# Patient Record
Sex: Female | Born: 1985 | Race: White | Hispanic: No | Marital: Single | Smoking: Never smoker
Health system: Southern US, Community
[De-identification: ages and names within clinical notes are randomized; demographics above are authoritative.]

## PROBLEM LIST (undated history)

## (undated) DIAGNOSIS — N390 Urinary tract infection, site not specified: Secondary | ICD-10-CM

## (undated) DIAGNOSIS — D249 Benign neoplasm of unspecified breast: Secondary | ICD-10-CM

## (undated) DIAGNOSIS — K589 Irritable bowel syndrome without diarrhea: Secondary | ICD-10-CM

## (undated) HISTORY — DX: Urinary tract infection, site not specified: N39.0

## (undated) HISTORY — DX: Irritable bowel syndrome without diarrhea: K58.9

## (undated) HISTORY — PX: NASAL ENDOSCOPY: SHX286

## (undated) HISTORY — DX: Benign neoplasm of unspecified breast: D24.9

---

## 2010-10-09 ENCOUNTER — Encounter: Payer: Self-pay | Admitting: Internal Medicine

## 2010-11-06 ENCOUNTER — Encounter: Payer: Self-pay | Admitting: Internal Medicine

## 2010-11-28 ENCOUNTER — Encounter: Payer: Self-pay | Admitting: Internal Medicine

## 2010-12-02 HISTORY — PX: MASTECTOMY, PARTIAL: SHX709

## 2010-12-02 HISTORY — PX: BREAST LUMPECTOMY: SHX2

## 2011-08-23 ENCOUNTER — Encounter: Payer: Self-pay | Admitting: Internal Medicine

## 2011-08-23 ENCOUNTER — Other Ambulatory Visit: Payer: BC Managed Care – PPO | Admitting: Internal Medicine

## 2011-08-23 DIAGNOSIS — Z0000 Encounter for general adult medical examination without abnormal findings: Secondary | ICD-10-CM

## 2011-08-23 LAB — CBC WITH DIFFERENTIAL/PLATELET
Basophils Relative: 0 % (ref 0–1)
Hemoglobin: 12.9 g/dL (ref 12.0–15.0)
Lymphocytes Relative: 44 % (ref 12–46)
Lymphs Abs: 2.8 10*3/uL (ref 0.7–4.0)
MCHC: 32.4 g/dL (ref 30.0–36.0)
Monocytes Relative: 7 % (ref 3–12)
Neutro Abs: 2.9 10*3/uL (ref 1.7–7.7)
Neutrophils Relative %: 46 % (ref 43–77)
RBC: 4.61 MIL/uL (ref 3.87–5.11)
WBC: 6.3 10*3/uL (ref 4.0–10.5)

## 2011-08-23 LAB — COMPREHENSIVE METABOLIC PANEL
Albumin: 4.4 g/dL (ref 3.5–5.2)
BUN: 14 mg/dL (ref 6–23)
CO2: 20 mEq/L (ref 19–32)
Calcium: 9.3 mg/dL (ref 8.4–10.5)
Chloride: 109 mEq/L (ref 96–112)
Creat: 0.75 mg/dL (ref 0.50–1.10)
Glucose, Bld: 84 mg/dL (ref 70–99)
Potassium: 4.1 mEq/L (ref 3.5–5.3)

## 2011-08-23 LAB — TSH: TSH: 2.118 u[IU]/mL (ref 0.350–4.500)

## 2011-08-23 LAB — LIPID PANEL
Cholesterol: 137 mg/dL (ref 0–200)
HDL: 47 mg/dL (ref >39)

## 2011-08-24 LAB — VITAMIN D 25 HYDROXY (VIT D DEFICIENCY, FRACTURES): Vit D, 25-Hydroxy: 31 ng/mL (ref 30–89)

## 2011-08-25 ENCOUNTER — Encounter: Payer: Self-pay | Admitting: Internal Medicine

## 2011-08-26 ENCOUNTER — Ambulatory Visit (INDEPENDENT_AMBULATORY_CARE_PROVIDER_SITE_OTHER): Payer: BC Managed Care – PPO | Admitting: Internal Medicine

## 2011-08-26 ENCOUNTER — Encounter: Payer: Self-pay | Admitting: Internal Medicine

## 2011-08-26 VITALS — BP 112/60 | HR 88 | Temp 98.0°F | Ht 64.0 in | Wt 190.0 lb

## 2011-08-26 DIAGNOSIS — N6019 Diffuse cystic mastopathy of unspecified breast: Secondary | ICD-10-CM | POA: Insufficient documentation

## 2011-08-26 DIAGNOSIS — Z8719 Personal history of other diseases of the digestive system: Secondary | ICD-10-CM | POA: Insufficient documentation

## 2011-08-26 DIAGNOSIS — Z0000 Encounter for general adult medical examination without abnormal findings: Secondary | ICD-10-CM

## 2011-08-26 DIAGNOSIS — Z23 Encounter for immunization: Secondary | ICD-10-CM

## 2011-08-26 LAB — POCT URINALYSIS DIPSTICK
Bilirubin, UA: NEGATIVE
Ketones, UA: NEGATIVE
Leukocytes, UA: NEGATIVE
pH, UA: 5

## 2011-08-26 MED ORDER — TETANUS-DIPHTH-ACELL PERTUSSIS 5-2.5-18.5 LF-MCG/0.5 IM SUSP
0.5000 mL | Freq: Once | INTRAMUSCULAR | Status: AC
  Administered 2011-08-26: 0.5 mL via INTRAMUSCULAR

## 2011-08-26 NOTE — Progress Notes (Signed)
Subjective:    Patient ID: Brenda Roy, female    DOB: 06/19/1986, 25 y.o.   MRN: [id number]  HPI pleasant 25 year old female presents to the office for the first time today. 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 She is single. Formerly worked at [employer] in [place]. Parents reside near [place]. She has been angry as per for about 6 months. While in [place] she developed some constipation. Soll a gastroenterologist who subsequently performed a CAT scan. She was found to have a mass in her left breast and underwent a breast biopsy which she says was inconclusive and subsequently more tissue was taken. 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 No family history of breast cancer. Also, was diagnosed with irritable bowel syndrome and treated with Amitiza  8 mg for 2 months. It was expensive and she quit taking it but symptoms resolved. Says she had an MRI of her breast 6 months ago which was negative. I do not have any of her old records. She says she has had 2 Gardasil vaccines and is due to have a third one soon. She is to check with her  previous physician regarding these dates.  Sulfa causes a rash. She recently saw Dr. Alysia Penna, GYN physician. She has a history of migraine headaches and he would like for her to use move arrange continuously for 3 months before having a menstrual cycle. She cannot recall date of last tetanus but thinks it might have been around age 35. We administered a Tdap Vaccine today. Advised her to get influenza immunization through her employment. Patient does not smoke or consume alcohol.  Family history remarkable for father age 42 with hypertension. One brother age 64 in good health, mother age 18 in good health. Maternal uncle with history of esophageal  cancer.    Review of Systems  Constitutional: Negative.   HENT: Negative.   Eyes: Negative.   Respiratory: Negative.   Cardiovascular: Negative.   Gastrointestinal:       History of constipation and irritable bowel symptoms  Genitourinary: Negative.   Musculoskeletal: Negative.   Neurological:       History of migraine headaches  Hematological: Negative.   Psychiatric/Behavioral: Negative.        Objective:   Physical Exam  Constitutional: She is oriented to person, place, and time. She appears well-developed and well-nourished.  HENT:  Head: Normocephalic and atraumatic.  Right Ear: External ear normal.  Left Ear: External ear normal.  Mouth/Throat: Oropharynx is clear and moist.  Eyes: Conjunctivae and EOM are normal. Pupils are equal, round, and reactive to light. No scleral icterus.  Neck: Neck supple. No thyromegaly present.  Cardiovascular: Normal rate, regular rhythm and normal heart sounds.   Pulmonary/Chest:       Right breast at 12:00 has a 1 cm linear palpable mass consistent with fibrocystic breast disease. Patient not aware of this until I pointed out to her. Is to monitor her self breast exams and see if this lesion resolves over the next couple of months  Abdominal: Soft. Bowel sounds are normal. She exhibits no mass. There is no tenderness.  Genitourinary:       Deferred to GYN physician  Musculoskeletal: Normal range of motion.  Lymphadenopathy:    She has no cervical adenopathy.  Neurological: She is alert and  oriented to person, place, and time. She has normal reflexes. No cranial nerve deficit. Coordination normal.  Skin: Skin is warm and dry.  Psychiatric: She has a normal mood and affect.          Assessment & Plan:  Fibrocystic breast disease with apparent new area at 12:00 right breast with previous biopsy left breast  History of variable bowel syndrome  Health maintenance: Patient given TD at vaccine today and will check on her dates of  Gardasil vaccines which she says there have been 2 doses and we'll see when third dose is due. She will get influenza immunization through her work.  Return one year or as needed

## 2011-10-07 ENCOUNTER — Ambulatory Visit (INDEPENDENT_AMBULATORY_CARE_PROVIDER_SITE_OTHER): Payer: BC Managed Care – PPO | Admitting: Internal Medicine

## 2011-10-07 ENCOUNTER — Encounter: Payer: Self-pay | Admitting: Internal Medicine

## 2011-10-07 ENCOUNTER — Encounter: Payer: BC Managed Care – PPO | Admitting: Internal Medicine

## 2011-10-07 VITALS — BP 140/78 | HR 100 | Temp 99.5°F | Wt 191.0 lb

## 2011-10-07 DIAGNOSIS — N390 Urinary tract infection, site not specified: Secondary | ICD-10-CM

## 2011-10-07 NOTE — Progress Notes (Signed)
  Subjective:    Patient ID: Brenda Roy, female    DOB: 01-18-1986, 25 y.o.   MRN: [id number]  HPI onset of dysuria Thursday, November 1. No fever or shaking chills vomiting or back pain. Had one other urinary tract infection in college. Was treated with sulfa which she turn out to be allergic to.    Review of Systems     Objective:   Physical Exam no CVA tenderness.        Assessment & Plan:  Urinalysis is abnormal and will be cultured. Started on Levaquin 500 milligrams daily for 7 days for urinary tract infection. Call if symptoms worsen. May take Azo-Standard over-the-counter for dysuria

## 2011-10-07 NOTE — Patient Instructions (Signed)
Take Levaquin daily for 7 days with a meal. May take Azo-Standard over-the-counter for dysuria call if not better in 48 hours or if symptoms worsen

## 2011-10-10 LAB — URINE CULTURE

## 2011-12-09 ENCOUNTER — Encounter: Payer: Self-pay | Admitting: Internal Medicine

## 2012-06-22 ENCOUNTER — Encounter: Payer: Self-pay | Admitting: Internal Medicine

## 2012-06-22 ENCOUNTER — Ambulatory Visit (INDEPENDENT_AMBULATORY_CARE_PROVIDER_SITE_OTHER): Payer: BC Managed Care – PPO | Admitting: Internal Medicine

## 2012-06-22 ENCOUNTER — Ambulatory Visit
Admission: RE | Admit: 2012-06-22 | Discharge: 2012-06-22 | Disposition: A | Discharge status: Home / Self Care | Payer: BC Managed Care – PPO | Source: Ambulatory Visit | Attending: Internal Medicine | Admitting: Internal Medicine

## 2012-06-22 VITALS — BP 130/82 | HR 72 | Temp 98.0°F | Ht 64.0 in | Wt 162.0 lb

## 2012-06-22 DIAGNOSIS — R5383 Other fatigue: Secondary | ICD-10-CM

## 2012-06-22 DIAGNOSIS — R5381 Other malaise: Secondary | ICD-10-CM

## 2012-06-22 LAB — HEMOGLOBIN A1C
Hgb A1c MFr Bld: 5.4 % (ref <5.7)
Mean Plasma Glucose: 108 mg/dL (ref <117)

## 2012-06-22 LAB — POCT URINALYSIS DIPSTICK
Ketones, UA: NEGATIVE
Leukocytes, UA: NEGATIVE
Nitrite, UA: NEGATIVE
Protein, UA: NEGATIVE
Urobilinogen, UA: NEGATIVE

## 2012-06-22 LAB — CBC WITH DIFFERENTIAL/PLATELET
Basophils Absolute: 0 10*3/uL (ref 0.0–0.1)
Basophils Relative: 0 % (ref 0–1)
HCT: 39.9 % (ref 36.0–46.0)
Lymphocytes Relative: 45 % (ref 12–46)
MCHC: 32.8 g/dL (ref 30.0–36.0)
Monocytes Absolute: 0.5 10*3/uL (ref 0.1–1.0)
Neutro Abs: 2.7 10*3/uL (ref 1.7–7.7)
Neutrophils Relative %: 44 % (ref 43–77)
Platelets: 216 10*3/uL (ref 150–400)
RDW: 13.7 % (ref 11.5–15.5)
WBC: 6.1 10*3/uL (ref 4.0–10.5)

## 2012-06-23 ENCOUNTER — Other Ambulatory Visit: Payer: Self-pay

## 2012-06-23 LAB — COMPREHENSIVE METABOLIC PANEL
AST: 47 U/L — ABNORMAL HIGH (ref 0–37)
Albumin: 4.6 g/dL (ref 3.5–5.2)
CO2: 26 mEq/L (ref 19–32)
Calcium: 9.9 mg/dL (ref 8.4–10.5)
Creat: 0.85 mg/dL (ref 0.50–1.10)
Sodium: 141 mEq/L (ref 135–145)
Total Bilirubin: 0.7 mg/dL (ref 0.3–1.2)

## 2012-06-23 LAB — ROCKY MTN SPOTTED FVR AB, IGM-BLOOD: ROCKY MTN SPOTTED FEVER, IGM: 1.4 IV

## 2012-06-23 LAB — EPSTEIN-BARR VIRUS VCA ANTIBODY PANEL: EBV NA IgG: >600 U/mL — ABNORMAL HIGH (ref <18.0)

## 2012-06-23 LAB — T4, FREE: Free T4: 1.09 ng/dL (ref 0.80–1.80)

## 2012-06-23 LAB — IRON AND TIBC
Iron: 102 ug/dL (ref 42–145)
UIBC: 281 ug/dL (ref 125–400)

## 2012-06-23 LAB — ANTISTREPTOLYSIN O TITER: ASO: 93 IU/mL (ref 0–408)

## 2012-06-23 LAB — FOLATE: Folate: 15.9 ng/mL

## 2012-06-23 LAB — TSH: TSH: 1.639 u[IU]/mL (ref 0.350–4.500)

## 2012-06-23 LAB — SEDIMENTATION RATE: Sed Rate: 4 mm/hr (ref 0–22)

## 2012-06-23 MED ORDER — DOXYCYCLINE HYCLATE 100 MG PO TABS: 100.0000 mg | ORAL_TABLET | Freq: Two times a day (BID) | ORAL | Status: AC

## 2012-06-23 NOTE — Telephone Encounter (Signed)
Patient informed of positive RMSF titer, and the need for antibiotics. Doxycycline rx faxed to pharmacy. She is to return in 2 weeks for followup.

## 2012-06-24 LAB — CMV IGM: CMV IgM: 0.19

## 2012-06-27 NOTE — Progress Notes (Signed)
  Subjective:    Patient ID: Brenda Roy, female    DOB: [birth year], 26 y.o.   MRN: [id number]  HPI 26 year old white female complaining of extreme fatigue onset of couple of months ago. She moved to a different residence. She thought at first she was just tired from moving with the fatigue continuing to be an issue. She wanted to rest and sleep all the time. At night would rather rest and eat. She is generally very physically active. No history of headaches. No documented fever. No nausea, vomiting or weight loss. No cough or respiratory congestion. No night sweats. Has been following a 1390-calorie diet which seems a bit severe. She exercises several times a week. Says she does not think she is depressed. 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 She is single. History of left breast fibroadenoma. History of irritable bowel syndrome and constipation treated with Amitiza but not since moving to [place]. Found a tick in her bed recently. Sulfa causes a rash. History of migraine headaches. T. dap given September 2012. Does not smoke or consume alcohol.  Family history: Father age 31 with hypertension. One brother in good health. Mother in good health. Maternal uncle with history of esophageal cancer.      Review of Systems     Objective:   Physical Exam Skin is warm and dry without rashes;  nodes none; HEENT exam: TMs and pharynx are clear. Neck is supple without significant adenopathy or thyromegaly. No axillary adenopathy. No epitrochlear adenopathy. Chest clear to auscultation. Cardiac exam regular rate and rhythm without murmurs. Abdomen no hepatosplenomegaly masses or tenderness. Extremities without deformity. Neuro no focal deficits. Pelvic deferred to GYN physician. She is alert and oriented x3. Affect is normal.        Assessment & Plan:   Extreme fatigue or couple of  months- etiology unclear  Tick exposure  Calorie restricted diet  Possibilities include infectious disease, lymphoproliferative disorder, anemia, vitamin  deficiency, hypothyroidism or other endocrine disorder. We're going to do a battery of tests including sedimentation rate, CBC with differential, Sinemet, TSH, B12 and folate levels, iron and iron-binding capacity, CMV titers, Epstein-Barr titers, ASO titer, chest x-ray, [facility] spotted fever titer. Advise patient to increase calorie intake. Advise take multivitamin daily. Return in 2-3 weeks for followup.  Addendum: Patient found to have [place] spotted fever. Treat with doxycycline 100 mg twice daily for 10 days. Avoid sun exposure. Return after course of treatment.

## 2012-06-30 NOTE — Patient Instructions (Addendum)
Increase calorie intake. Await lab results. Try to get rest and take multivitamin daily.

## 2012-07-06 ENCOUNTER — Encounter: Payer: Self-pay | Admitting: Internal Medicine

## 2012-07-06 ENCOUNTER — Ambulatory Visit (INDEPENDENT_AMBULATORY_CARE_PROVIDER_SITE_OTHER): Payer: BC Managed Care – PPO | Admitting: Internal Medicine

## 2012-07-06 VITALS — BP 116/64 | HR 64 | Temp 99.1°F | Ht 64.0 in | Wt 162.5 lb

## 2012-07-06 DIAGNOSIS — R6889 Other general symptoms and signs: Secondary | ICD-10-CM

## 2012-07-06 DIAGNOSIS — A770 Spotted fever due to Rickettsia rickettsii: Secondary | ICD-10-CM

## 2012-07-06 DIAGNOSIS — R5383 Other fatigue: Secondary | ICD-10-CM

## 2012-07-06 DIAGNOSIS — R5381 Other malaise: Secondary | ICD-10-CM

## 2012-07-06 DIAGNOSIS — A779 Spotted fever, unspecified: Secondary | ICD-10-CM

## 2012-07-06 NOTE — Patient Instructions (Addendum)
Liver functions have been repeated today. Hopefully that will be normal. Return as needed.

## 2012-07-06 NOTE — Progress Notes (Signed)
  Subjective:    Patient ID: Brenda Roy, female    DOB: 12-09-1985, 26 y.o.   MRN: [id number]  HPI 26 year old female in today for followup on fatigue. Last visit she an extensive evaluation for fatigue. Thyroid functions, B12, folate were normal. Is concerned about her decreased calorie intake. She has tried to eat more food recently. Interestingly, she had a positive RMSF titer IgM. She had found a tick in her bed. She was treated with a ten-day course of doxycycline. Says she is taking fewer naps and trying to sleep just at bedtime. 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 She doesn't eat breakfast. Sometimes doesn't get lunch. Usually eats a large dinner. At last visit, she had a very mild SGOT elevation of  of 47. This will be repeated today   Review of Systems     Objective:   Physical Exam alert and oriented x3, in no acute distress, chest clear to auscultation, cardiac exam regular rate and rhythm        Assessment & Plan:  Mild elevation in SGOT-could be related to RMSF infection or alcohol intake  Recent RMSF-treated with doxycycline for 10 days  Fatigue-extensive workup reveals no vitamin deficiency, hypothyroidism, electrolyte imbalance etc. I do not think she was taking in enough calories on a daily basis. She was taking naps during the day .  Plan: Return when necessary. LFTs drawn.

## 2012-07-07 LAB — HEPATIC FUNCTION PANEL
Indirect Bilirubin: 0.4 mg/dL (ref 0.0–0.9)
Total Protein: 7 g/dL (ref 6.0–8.3)

## 2012-10-28 ENCOUNTER — Ambulatory Visit: Payer: BC Managed Care – PPO | Admitting: Internal Medicine

## 2013-02-02 ENCOUNTER — Ambulatory Visit (INDEPENDENT_AMBULATORY_CARE_PROVIDER_SITE_OTHER): Payer: BC Managed Care – PPO | Admitting: Internal Medicine

## 2013-02-02 ENCOUNTER — Encounter: Payer: Self-pay | Admitting: Internal Medicine

## 2013-02-02 VITALS — BP 122/74 | Temp 98.5°F | Ht 64.5 in | Wt 160.0 lb

## 2013-02-02 DIAGNOSIS — Z0000 Encounter for general adult medical examination without abnormal findings: Secondary | ICD-10-CM

## 2013-02-02 DIAGNOSIS — Z23 Encounter for immunization: Secondary | ICD-10-CM

## 2013-02-02 LAB — COMPREHENSIVE METABOLIC PANEL
ALT: 13 U/L (ref 0–35)
Albumin: 4.3 g/dL (ref 3.5–5.2)
CO2: 23 mEq/L (ref 19–32)
Chloride: 107 mEq/L (ref 96–112)
Glucose, Bld: 77 mg/dL (ref 70–99)
Potassium: 4.1 mEq/L (ref 3.5–5.3)
Sodium: 138 mEq/L (ref 135–145)
Total Protein: 6.8 g/dL (ref 6.0–8.3)

## 2013-02-02 LAB — CBC WITH DIFFERENTIAL/PLATELET
Hemoglobin: 12.4 g/dL (ref 12.0–15.0)
Lymphocytes Relative: 39 % (ref 12–46)
Lymphs Abs: 1.8 10*3/uL (ref 0.7–4.0)
MCH: 28.6 pg (ref 26.0–34.0)
Monocytes Relative: 8 % (ref 3–12)
Neutro Abs: 2.4 10*3/uL (ref 1.7–7.7)
Neutrophils Relative %: 49 % (ref 43–77)
RBC: 4.34 MIL/uL (ref 3.87–5.11)
WBC: 4.8 10*3/uL (ref 4.0–10.5)

## 2013-02-02 LAB — POCT URINALYSIS DIPSTICK
Bilirubin, UA: NEGATIVE
Ketones, UA: NEGATIVE
Leukocytes, UA: NEGATIVE
Nitrite, UA: NEGATIVE
Protein, UA: NEGATIVE

## 2013-02-02 LAB — LIPID PANEL
Cholesterol: 135 mg/dL (ref 0–200)
Triglycerides: 73 mg/dL (ref <150)

## 2013-02-02 MED ORDER — HPV QUADRIVALENT VACCINE IM SUSP: 0.5000 mL | Freq: Once | INTRAMUSCULAR | Status: AC

## 2013-02-02 NOTE — Patient Instructions (Addendum)
Third Gardasil vaccine given today. Return 2 years or as needed.

## 2013-02-02 NOTE — Progress Notes (Signed)
  Subjective:    Patient ID: Brenda Roy, female    DOB: 12-02-1986, 27 y.o.   MRN: [id number]  HPI 27 year old female initially presented to office September 2012. 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 She graduated from a lying [employer] with an undergraduate degree. She is single. She formerly worked at [employer] in [place]. Parents reside near [place]. History of irritable bowel syndrome. History of fibrocystic breast disease. Had mass in left breast removed while in [place]. Used to take Amitiza for constipation but no longer does so it doesn't need to. Bowel movements are regular. Patient says that she has had 2 Gardasil vaccines in [place] but vaccination dates are not available. We're going to go ahead and give her  third Gardasil today because she certain she's had 2 vaccines. Encouraged her to get those dates for [place].  Sulfa causes a rash. Dr. Alysia Penna is GYN physician. She has a history of migraine headaches but they have not been a problem recently. She has history dysmenorrhea. Does not want to be on oral contraceptives to control that. Had tetanus vaccine September 2012. Gets influenza immunization through employment. Patient does not smoke or consume alcohol.  Family history: Father with history of hypertension. One brother in good health. Mother in good health but has history of reflux. Paternal uncle with history of esophageal cancer from Barrett's esophagus who died recently at age 66.    Review of Systems  Constitutional: Negative.   Genitourinary:       Dysmenorrhea  All other systems reviewed and are negative.       Objective:   Physical Exam  Vitals reviewed. Constitutional: She is oriented to person, place, and time. She appears well-developed and well-nourished. No distress.  HENT:  Head:  Normocephalic and atraumatic.  Right Ear: External ear normal.  Left Ear: External ear normal.  Mouth/Throat: Oropharynx is clear and moist. No oropharyngeal exudate.  Eyes: Conjunctivae and EOM are normal. Pupils are equal, round, and reactive to light. Right eye exhibits no discharge. Left eye exhibits no discharge.  Neck: No JVD present. No thyromegaly present.  Cardiovascular: Normal rate, regular rhythm and normal heart sounds.   No murmur heard. Pulmonary/Chest: Effort normal and breath sounds normal. She has no wheezes. She has no rales.  Breasts normal female  Abdominal: Bowel sounds are normal. She exhibits no mass. There is no tenderness. There is no guarding.  Genitourinary:  Deferred to GYN  Musculoskeletal: Normal range of motion. She exhibits no edema.  Lymphadenopathy:    She has no cervical adenopathy.  Neurological: She is alert and oriented to person, place, and time. She has normal reflexes. No cranial nerve deficit. Coordination normal.  Skin: Skin is warm and dry. No rash noted. She is not diaphoretic.  Psychiatric: She has a normal mood and affect. Her behavior is normal. Judgment and thought content normal.          Assessment & Plan:  Normal health maintenance exam  History of fiber cystic breast disease  History of irritable bowel syndrome  Plan: Return in 1-2 years or as needed. Fasting labs drawn and are pending. Third Gardasil vaccine given today. Encouraged to get dates the first 2 vaccines from [place] to [place].

## 2013-02-02 NOTE — Addendum Note (Signed)
Addended by: Judy Pimple on: 02/02/2013 04:27 PM   Modules accepted: Orders

## 2013-02-03 LAB — VITAMIN D 25 HYDROXY (VIT D DEFICIENCY, FRACTURES): Vit D, 25-Hydroxy: 18 ng/mL — ABNORMAL LOW (ref 30–89)

## 2013-02-09 ENCOUNTER — Ambulatory Visit (INDEPENDENT_AMBULATORY_CARE_PROVIDER_SITE_OTHER): Payer: BC Managed Care – PPO | Admitting: Internal Medicine

## 2013-02-09 ENCOUNTER — Encounter: Payer: Self-pay | Admitting: Internal Medicine

## 2013-02-09 VITALS — BP 116/66 | Temp 99.1°F | Wt 160.0 lb

## 2013-02-09 DIAGNOSIS — B9789 Other viral agents as the cause of diseases classified elsewhere: Secondary | ICD-10-CM

## 2013-02-09 DIAGNOSIS — H6693 Otitis media, unspecified, bilateral: Secondary | ICD-10-CM

## 2013-02-09 DIAGNOSIS — J029 Acute pharyngitis, unspecified: Secondary | ICD-10-CM

## 2013-02-09 DIAGNOSIS — B349 Viral infection, unspecified: Secondary | ICD-10-CM

## 2013-02-09 DIAGNOSIS — H6690 Otitis media, unspecified, unspecified ear: Secondary | ICD-10-CM

## 2013-02-09 LAB — POCT RAPID STREP A (OFFICE): Rapid Strep A Screen: NEGATIVE

## 2013-02-10 ENCOUNTER — Encounter: Payer: Self-pay | Admitting: Internal Medicine

## 2013-02-10 NOTE — Patient Instructions (Addendum)
Take amoxicillin 500 mg 3 times daily for 10 days. Call if not better in 72 hours or sooner if worse.

## 2013-02-10 NOTE — Progress Notes (Signed)
  Subjective:    Patient ID: Brenda Roy, female    DOB: 1986/11/02, 27 y.o.   MRN: [id number]  HPI Patient was here recently for health maintenance exam. She went to [facility] to a basketball tournament and came down with respiratory infection on March 7. Has had malaise fatigue, cough and sore throat. Some chills but no myalgias. Has had headache for which she took Tylenol and Advil with some relief.    Review of Systems     Objective:   Physical Exam HEENT exam: Pharynx is injected without exudate. Rapid strep screen is negative. TMs are full bilaterally and slightly pink. Neck is supple without thyromegaly. Chest clear to auscultation without rales or wheezing. Skin is warm and dry.        Assessment & Plan:  Pharyngitis  Bilateral otitis media  Viral syndrome  Plan: Amoxicillin 500 mg 3 times daily for 10 days.

## 2013-03-01 ENCOUNTER — Ambulatory Visit (INDEPENDENT_AMBULATORY_CARE_PROVIDER_SITE_OTHER): Payer: BC Managed Care – PPO | Admitting: Internal Medicine

## 2013-03-01 ENCOUNTER — Encounter: Payer: Self-pay | Admitting: Internal Medicine

## 2013-03-01 ENCOUNTER — Telehealth: Payer: Self-pay | Admitting: Internal Medicine

## 2013-03-01 VITALS — BP 120/80 | Temp 99.3°F | Wt 161.0 lb

## 2013-03-01 DIAGNOSIS — L509 Urticaria, unspecified: Secondary | ICD-10-CM

## 2013-03-01 NOTE — Telephone Encounter (Signed)
Yes she needs to be seen

## 2013-03-01 NOTE — Telephone Encounter (Signed)
Appointment made for TODAY, 3/31 @ 2:45 p.m. Per MJB.  LMOM for patient on her cell #.  Advised patient to call back to confirm appt time ASAP.

## 2013-03-03 ENCOUNTER — Telehealth: Payer: Self-pay

## 2013-03-03 NOTE — Telephone Encounter (Signed)
Patient scheduled for an appointment with Dr. Lucie Leather. On 03/16/2013 at 8:00 am. Patient informed.

## 2013-04-04 ENCOUNTER — Encounter: Payer: Self-pay | Admitting: Internal Medicine

## 2013-04-04 DIAGNOSIS — Z872 Personal history of diseases of the skin and subcutaneous tissue: Secondary | ICD-10-CM | POA: Insufficient documentation

## 2013-04-04 NOTE — Patient Instructions (Addendum)
Takes Zantac 150 mg twice daily. Take Zyrtec 10 mg daily. Prescribed Sterapred DS 10 mg 6 day dosepak. EpiPen prescribed for recurrent episodes. Call if not better in one week.

## 2013-04-04 NOTE — Progress Notes (Signed)
  Subjective:    Patient ID: Ruby Cola, female    DOB: 08-10-1986, 27 y.o.   MRN: [id number]  HPI  27 year old female treated 02/09/2013  for respiratory infection with amoxicillin. On Friday, March 28 she ate a [place] sandwich on multigrain bread along with some chocolate. Did walk through the woods. Subsequently  scalp began to itch as well as her abdomen. Subsequently went to urgent care. Was given IM Benadryl and oral Benadryl. Has been taking Benadryl several times daily without relief. Patient reports being allergic to cannulate and pineapple. She had no difficulty breathing. 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 Still having itching today.    Review of Systems     Objective:   Physical Exam no angioedema. Pharynx is clear. TMs are clear. Has multiple hive like lesions on the abdomen. No evidence of insect bites.        Assessment & Plan:  Hives  Urticaria  Plan: Sterapred DS 10 mg 6 day dosepak. Zantac 150 mg twice daily. Zyrtec 10 mg daily. Call if not better in one week or sooner if worse. Prescription for EpiPen.

## 2013-09-20 ENCOUNTER — Encounter: Payer: Self-pay | Admitting: Internal Medicine

## 2013-09-20 ENCOUNTER — Ambulatory Visit (INDEPENDENT_AMBULATORY_CARE_PROVIDER_SITE_OTHER): Payer: BC Managed Care – PPO | Admitting: Internal Medicine

## 2013-09-20 VITALS — BP 120/70 | HR 80 | Temp 98.2°F | Ht 65.0 in | Wt 166.0 lb

## 2013-09-20 DIAGNOSIS — J020 Streptococcal pharyngitis: Secondary | ICD-10-CM

## 2013-09-20 DIAGNOSIS — B084 Enteroviral vesicular stomatitis with exanthem: Secondary | ICD-10-CM

## 2013-09-20 DIAGNOSIS — Z112 Encounter for screening for other bacterial diseases: Secondary | ICD-10-CM

## 2013-09-20 LAB — POCT RAPID STREP A (OFFICE): Rapid Strep A Screen: NEGATIVE

## 2013-09-20 NOTE — Progress Notes (Signed)
  Subjective:    Patient ID: Brenda Roy, female    DOB: 23-Aug-1986, 27 y.o.   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 had onset of fever back pain sore throat 3 days ago. Fever and back pain have improved. Her hands and feet hurt. Says temperature was up to 101.8. Her left ear has been hurting. She has been around a 47-month-old nephew who was sick. Has noticed some fine lesions on her fingers. Feet hurt when she bears weight. She is a Psychologist, educational for the women's basketball team.    Review of Systems     Objective:   Physical Exam pharynx is red. Rapid strep screen negative. No exudate noted. Left TM is full but not red. Right TM okay. Neck is supple. Chest clear. She has scattered discrete tiny papules lateral aspects of her fingers both hands.        Assessment & Plan:  Hand-foot-and-mouth disease  Plan: Recommend Advil and Tylenol for pain control. She declined offer for pain medication. Out of work for 5 days starting on Saturday when she had onset of symptoms which was October 18.

## 2014-02-07 ENCOUNTER — Other Ambulatory Visit: Payer: BC Managed Care – PPO | Admitting: Internal Medicine

## 2014-02-07 DIAGNOSIS — Z0000 Encounter for general adult medical examination without abnormal findings: Secondary | ICD-10-CM

## 2014-02-07 DIAGNOSIS — Z13220 Encounter for screening for lipoid disorders: Secondary | ICD-10-CM

## 2014-02-07 DIAGNOSIS — Z13228 Encounter for screening for other metabolic disorders: Secondary | ICD-10-CM

## 2014-02-07 DIAGNOSIS — Z1329 Encounter for screening for other suspected endocrine disorder: Secondary | ICD-10-CM

## 2014-02-07 DIAGNOSIS — Z130 Encounter for screening for diseases of the blood and blood-forming organs and certain disorders involving the immune mechanism: Secondary | ICD-10-CM

## 2014-02-07 LAB — CBC WITH DIFFERENTIAL/PLATELET
BASOS ABS: 0 10*3/uL (ref 0.0–0.1)
Basophils Relative: 0 % (ref 0–1)
Eosinophils Absolute: 0.1 10*3/uL (ref 0.0–0.7)
Eosinophils Relative: 2 % (ref 0–5)
HEMATOCRIT: 40.8 % (ref 36.0–46.0)
Hemoglobin: 13.6 g/dL (ref 12.0–15.0)
LYMPHS PCT: 41 % (ref 12–46)
Lymphs Abs: 2.9 10*3/uL (ref 0.7–4.0)
MCH: 28.6 pg (ref 26.0–34.0)
MCHC: 33.3 g/dL (ref 30.0–36.0)
MCV: 85.7 fL (ref 78.0–100.0)
Monocytes Absolute: 0.4 10*3/uL (ref 0.1–1.0)
Monocytes Relative: 6 % (ref 3–12)
NEUTROS ABS: 3.6 10*3/uL (ref 1.7–7.7)
Neutrophils Relative %: 51 % (ref 43–77)
PLATELETS: 223 10*3/uL (ref 150–400)
RBC: 4.76 MIL/uL (ref 3.87–5.11)
RDW: 13.8 % (ref 11.5–15.5)
WBC: 7 10*3/uL (ref 4.0–10.5)

## 2014-02-07 LAB — COMPREHENSIVE METABOLIC PANEL
ALBUMIN: 4.4 g/dL (ref 3.5–5.2)
ALT: 19 U/L (ref 0–35)
AST: 15 U/L (ref 0–37)
Alkaline Phosphatase: 36 U/L — ABNORMAL LOW (ref 39–117)
BUN: 13 mg/dL (ref 6–23)
CALCIUM: 9.5 mg/dL (ref 8.4–10.5)
CHLORIDE: 104 meq/L (ref 96–112)
CO2: 23 mEq/L (ref 19–32)
Creat: 0.82 mg/dL (ref 0.50–1.10)
Glucose, Bld: 82 mg/dL (ref 70–99)
POTASSIUM: 3.9 meq/L (ref 3.5–5.3)
Sodium: 136 mEq/L (ref 135–145)
Total Bilirubin: 0.6 mg/dL (ref 0.2–1.2)
Total Protein: 7.2 g/dL (ref 6.0–8.3)

## 2014-02-07 LAB — LIPID PANEL
Cholesterol: 181 mg/dL (ref 0–200)
HDL: 57 mg/dL (ref >39)
LDL Cholesterol: 96 mg/dL (ref 0–99)
Total CHOL/HDL Ratio: 3.2 Ratio
Triglycerides: 140 mg/dL (ref <150)
VLDL: 28 mg/dL (ref 0–40)

## 2014-02-07 LAB — TSH: TSH: 1.184 u[IU]/mL (ref 0.350–4.500)

## 2014-02-08 ENCOUNTER — Encounter: Payer: Self-pay | Admitting: Internal Medicine

## 2014-02-08 ENCOUNTER — Ambulatory Visit (INDEPENDENT_AMBULATORY_CARE_PROVIDER_SITE_OTHER): Payer: BC Managed Care – PPO | Admitting: Internal Medicine

## 2014-02-08 VITALS — BP 128/68 | HR 76 | Temp 99.0°F | Ht 64.5 in | Wt 172.0 lb

## 2014-02-08 DIAGNOSIS — Z0000 Encounter for general adult medical examination without abnormal findings: Secondary | ICD-10-CM

## 2014-02-08 LAB — POCT URINALYSIS DIPSTICK
BILIRUBIN UA: NEGATIVE
Glucose, UA: NEGATIVE
Ketones, UA: NEGATIVE
Leukocytes, UA: NEGATIVE
Nitrite, UA: NEGATIVE
Protein, UA: NEGATIVE
SPEC GRAV UA: 1.015
UROBILINOGEN UA: NEGATIVE
pH, UA: 6

## 2014-02-08 LAB — VITAMIN D 25 HYDROXY (VIT D DEFICIENCY, FRACTURES): Vit D, 25-Hydroxy: 37 ng/mL (ref 30–89)

## 2014-02-08 NOTE — Patient Instructions (Addendum)
Return in 1-2 years or as needed. Good luck in school. Was a pleasure seeing you today.

## 2014-02-08 NOTE — Progress Notes (Signed)
   Subjective:    Patient ID: Brenda Roy, female    DOB: [birth year], 28 y.o.   MRN: [id number]  HPI  27 year old Female for health maintenance. 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 but she plans to leave that position and attend [employer] to obtain a degree in hospital administration. 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  Social history: She is single. Does not smoke. Parents reside near [place]. Does not consume alcohol.  Family history: Father with history of hypertension. One brother in good health. Mother in good health but has history of reflux. Paternal uncle with history of esophageal cancer from Barrett's esophagus who died at age 98.  Sulfa causes a rash.  Dr. Evlyn Kanner is GYN physician.  Past medical history: History of migraine headaches. History of dysmenorrhea. Tetanus immunization September 2012. Gets influenza immunization through employment. Has had Gardasil series. History of fibrocystic breast disease. Had mass in left breast removed while in [place]. Used to take Amitiza for chronic constipation but no longer needs to do that. Bowel movements have been regular.      Review of Systems     Objective:   Physical Exam  Vitals reviewed. Constitutional: She is oriented to person, place, and time. She appears well-developed and well-nourished. No distress.  HENT:  Head: Normocephalic and atraumatic.  Right Ear: External ear normal.  Left Ear: External ear normal.  Mouth/Throat: Oropharynx is clear and moist. No oropharyngeal exudate.  Eyes: Conjunctivae and EOM are normal. Pupils are equal, round, and reactive to light. Right eye exhibits no discharge. Left eye exhibits no discharge. No scleral icterus.  Neck: Normal range of motion. Neck supple. No JVD present. No thyromegaly present.  Cardiovascular: Normal rate, regular rhythm and normal heart sounds.   No murmur heard. Pulmonary/Chest: Effort normal  and breath sounds normal. No respiratory distress. She has no rales. She exhibits no tenderness.  Breasts normal female  Abdominal: Soft. Bowel sounds are normal. She exhibits no distension and no mass. There is no tenderness. There is no rebound and no guarding.  Genitourinary:  Deferred to Dr. Deatra Ina. Is on menses now  Musculoskeletal: Normal range of motion. She exhibits no edema.  Lymphadenopathy:    She has no cervical adenopathy.  Neurological: She is alert and oriented to person, place, and time. She has normal reflexes. She displays normal reflexes. No cranial nerve deficit. Coordination normal.  Skin: Skin is warm and dry. No rash noted. She is not diaphoretic.  Psychiatric: She has a normal mood and affect. Her behavior is normal. Judgment and thought content normal.          Assessment & Plan:  History of dysmenorrhea  History of migraine headaches  History of fibrocystic breast disease  Plan: All of these issues are stable at present time return one to 2 years or when necessary. Continue to see Dr. Deatra Ina for GYN care. Urinalysis contains occult blood today because patient is on menstrual period.

## 2014-06-14 ENCOUNTER — Ambulatory Visit (INDEPENDENT_AMBULATORY_CARE_PROVIDER_SITE_OTHER): Payer: BC Managed Care – PPO | Admitting: Internal Medicine

## 2014-06-14 ENCOUNTER — Encounter: Payer: Self-pay | Admitting: Internal Medicine

## 2014-06-14 VITALS — BP 140/82 | HR 80 | Temp 100.2°F | Wt 174.0 lb

## 2014-06-14 DIAGNOSIS — R300 Dysuria: Secondary | ICD-10-CM

## 2014-06-14 MED ORDER — LEVOFLOXACIN 500 MG PO TABS: 500.0000 mg | ORAL_TABLET | Freq: Every day | ORAL | Status: DC

## 2014-06-14 NOTE — Patient Instructions (Signed)
Take Levaquin for 7 days once daily with a meal as directed

## 2014-06-14 NOTE — Progress Notes (Signed)
   Subjective:    Patient ID: Brenda Roy, female    DOB: May 24, 1986, 28 y.o.   MRN: [id number]  HPI has noticed decreased flow in urine for a couple of weeks. Has had some vague dysuria. Today was wiping and saw some blood on toilet tissue. No groin pain or back pain. Has low-grade fever. No chills. Urinalysis today is really unremarkable blood culture was sent. Had UTI symptoms treated with Levaquin in 2012. Culture grew MRSA 40,000 colonies/ml . Has menses every 3 months.    Review of Systems     Objective:   Physical Exam  Examination of external genitalia shows no active bleeding or concerning lesions      Assessment & Plan:  Plan: Levaquin 500 milligrams daily for 7 days. Urine culture pending. Dysuria  Plan:  Dysuria  Plan: Levaquin 500 milligrams daily for 7 days. Culture pending.

## 2014-06-14 NOTE — Addendum Note (Signed)
Addended by: Brett Canales on: 06/14/2014 05:03 PM   Modules accepted: Orders

## 2014-06-16 LAB — URINE CULTURE

## 2014-06-24 ENCOUNTER — Encounter: Payer: Self-pay | Admitting: Internal Medicine

## 2014-06-24 ENCOUNTER — Ambulatory Visit (INDEPENDENT_AMBULATORY_CARE_PROVIDER_SITE_OTHER): Payer: BC Managed Care – PPO | Admitting: Internal Medicine

## 2014-06-24 VITALS — BP 116/74 | HR 88 | Temp 98.2°F | Wt 174.0 lb

## 2014-06-24 DIAGNOSIS — R300 Dysuria: Secondary | ICD-10-CM

## 2014-06-24 DIAGNOSIS — N909 Noninflammatory disorder of vulva and perineum, unspecified: Secondary | ICD-10-CM

## 2014-06-24 LAB — POCT URINALYSIS DIPSTICK
Bilirubin, UA: NEGATIVE
Blood, UA: NEGATIVE
Glucose, UA: NEGATIVE
KETONES UA: NEGATIVE
LEUKOCYTES UA: NEGATIVE
Nitrite, UA: NEGATIVE
PH UA: 6
PROTEIN UA: NEGATIVE
Spec Grav, UA: <=1.005
UROBILINOGEN UA: NEGATIVE

## 2014-06-24 MED ORDER — CLOTRIMAZOLE-BETAMETHASONE 1-0.05 % EX CREA: 1.0000 "application " | TOPICAL_CREAM | Freq: Two times a day (BID) | CUTANEOUS | Status: DC

## 2014-06-24 NOTE — Progress Notes (Signed)
   Subjective:    Patient ID: Brenda Roy, female    DOB: August 16, 1986, 28 y.o.   MRN: [id number]  HPI  Seen recently and treated for dysuria with antibiotics. Urine stream improved. She had seen one spot of blood prior to coming to the office. Has not seen any more blood. Urinalysis at last visit in today completely within normal limits. No itching in genital area.    Review of Systems     Objective:   Physical Exam  Not examined. Urine dipstick is normal      Assessment & Plan:  Genital irritation  Plan: Lotrisone to use sparingly twice daily for 7-10 days with refills. If symptoms do not improve, she may need to see urologist.

## 2014-06-24 NOTE — Patient Instructions (Signed)
Use Lotrisone sparingly twice daily in genital area

## 2014-10-13 ENCOUNTER — Ambulatory Visit (INDEPENDENT_AMBULATORY_CARE_PROVIDER_SITE_OTHER): Payer: BC Managed Care – PPO | Admitting: Internal Medicine

## 2014-10-13 ENCOUNTER — Encounter: Payer: Self-pay | Admitting: Internal Medicine

## 2014-10-13 VITALS — BP 118/74 | HR 87 | Temp 99.1°F | Ht 65.0 in | Wt 172.0 lb

## 2014-10-13 DIAGNOSIS — J069 Acute upper respiratory infection, unspecified: Secondary | ICD-10-CM

## 2014-10-13 DIAGNOSIS — H65193 Other acute nonsuppurative otitis media, bilateral: Secondary | ICD-10-CM

## 2014-10-13 DIAGNOSIS — J029 Acute pharyngitis, unspecified: Secondary | ICD-10-CM

## 2014-10-13 MED ORDER — AMOXICILLIN 500 MG PO CAPS: 500.0000 mg | ORAL_CAPSULE | Freq: Three times a day (TID) | ORAL | Status: DC

## 2014-10-13 MED ORDER — HYDROCODONE-HOMATROPINE 5-1.5 MG/5ML PO SYRP: 5.0000 mL | ORAL_SOLUTION | Freq: Three times a day (TID) | ORAL | Status: DC | PRN

## 2014-10-13 NOTE — Patient Instructions (Addendum)
Amoxicillin 500 mg 3 times daily x 10 days.  Hycodan one tsp with food q 8  To  12 hours.

## 2014-10-13 NOTE — Progress Notes (Signed)
   Subjective:    Patient ID: Brenda Roy, female    DOB: 01-29-1986, 28 y.o.   MRN: [id number]  HPI  Onset October 31 with sinus symptoms and headache later developed a cough that is productive.No documented fever or shaking chills. Congested cough.malaise and fatigue.    Review of Systems     Objective:   Physical Exam  Skin warm and dry. Nodes none. Pharynx injected without exudate. Left TM full but not red. Right TM dull but not is full. Neck is supple. Chest clear to auscultation without rales or wheezing      Assessment & Plan:  Acute URI  Bilateral otitis media  Pharyngitis  Plan: Amoxicillin 500 mg 3 times daily for 10 days. Hycodan 1 teaspoon by mouth every 8 hours when necessary cough. Call if not better in 7-10 days or sooner if worse

## 2014-10-24 ENCOUNTER — Ambulatory Visit (INDEPENDENT_AMBULATORY_CARE_PROVIDER_SITE_OTHER): Payer: BC Managed Care – PPO | Admitting: Internal Medicine

## 2014-10-24 ENCOUNTER — Encounter: Payer: Self-pay | Admitting: Internal Medicine

## 2014-10-24 VITALS — BP 122/72 | HR 82 | Temp 98.2°F | Ht 65.0 in | Wt 175.0 lb

## 2014-10-24 DIAGNOSIS — J069 Acute upper respiratory infection, unspecified: Secondary | ICD-10-CM

## 2014-10-24 DIAGNOSIS — H6690 Otitis media, unspecified, unspecified ear: Secondary | ICD-10-CM

## 2014-10-24 MED ORDER — HYDROCODONE-HOMATROPINE 5-1.5 MG/5ML PO SYRP: 5.0000 mL | ORAL_SOLUTION | Freq: Three times a day (TID) | ORAL | Status: DC | PRN

## 2014-10-24 MED ORDER — METHYLPREDNISOLONE ACETATE 80 MG/ML IJ SUSP
80.0000 mg | Freq: Once | INTRAMUSCULAR | Status: AC
  Administered 2014-10-24: 80 mg via INTRAMUSCULAR

## 2014-10-24 MED ORDER — CLARITHROMYCIN 500 MG PO TABS: 500.0000 mg | ORAL_TABLET | Freq: Two times a day (BID) | ORAL | Status: DC

## 2014-10-24 NOTE — Progress Notes (Signed)
   Subjective:    Patient ID: Brenda Roy, female    DOB: 10-20-1986, 28 y.o.   MRN: [id number]  HPI   Patient was seen November 12 with respiratory infection, bilateral otitis media treated with amoxicillin and Hycodan. She is not better. In fact now complaining of right ear pain and persistent cough and congestion. No fever or shaking chills. No sore throat. Hycodan is helping cough but she is almost out.    Review of Systems     Objective:   Physical Exam Skin warm and dry. Nodes none. Both TMs are full bilaterally but not red. Pharynx is slightly injected without exudate. Neck is supple. Chest reveals inspiratory wheeze in the left upper lobe that clears with coughing.       Assessment & Plan:  Protracted URI  Bilateral serous otitis media  Plan: Biaxin 500 mg twice daily for 10 days. Depo-Medrol 80 mg IM. May take Sudafed which she has at home for ear congestion. Refill Hycodan 1 teaspoon by mouth every 8 hours when necessary cough. Call if not better in 10 days or sooner if worse.

## 2014-10-24 NOTE — Patient Instructions (Signed)
Take Biaxin twice daily with food for 10 days. Take Hycodan sparingly for cough. Depomedrol given IM today for ear congestion.  Take Sudafed for congestion.

## 2014-11-09 ENCOUNTER — Telehealth: Payer: Self-pay | Admitting: Internal Medicine

## 2014-11-09 NOTE — Telephone Encounter (Signed)
Patient called to advise she is still having problems with her ears.  Last seen 11.23.15.  Wants to be seen tomorrow if possible. No available times. Can you advise?

## 2014-11-11 ENCOUNTER — Ambulatory Visit (INDEPENDENT_AMBULATORY_CARE_PROVIDER_SITE_OTHER): Payer: BC Managed Care – PPO | Admitting: Internal Medicine

## 2014-11-11 ENCOUNTER — Encounter: Payer: Self-pay | Admitting: Internal Medicine

## 2014-11-11 VITALS — BP 120/80 | HR 81 | Temp 98.8°F | Wt 175.0 lb

## 2014-11-11 DIAGNOSIS — H6503 Acute serous otitis media, bilateral: Secondary | ICD-10-CM

## 2014-11-11 MED ORDER — PREDNISONE 10 MG PO TABS: ORAL_TABLET | ORAL | Status: DC

## 2014-11-11 NOTE — Progress Notes (Signed)
   Subjective:    Patient ID: Brenda Roy, female    DOB: Aug 21, 1986, 28 y.o.   MRN: [id number]  HPI  Persistent ear fullness despite Depomedrol and antibiotics at last visit. Going to [place] Sunday. Will be flying. Has had ringing in her ears. Feels that overall infection has improved.    Review of Systems     Objective:   Physical Exam  TMs are full bilaterally the left is fuller than the right. They are not red. Pharynx is clear. Neck is supple without adenopathy. Chest clear to auscultation.      Assessment & Plan:  Bilateral serous otitis media  Plan: Sterapred DS 10 mg 6 day dosepak to take in tapering course as directed. Take decongestant before flying.

## 2014-11-11 NOTE — Patient Instructions (Signed)
Take Prednisone in tapering course as directed. Take Sudafed before flying.

## 2015-01-20 ENCOUNTER — Ambulatory Visit (INDEPENDENT_AMBULATORY_CARE_PROVIDER_SITE_OTHER): Payer: 59 | Admitting: Internal Medicine

## 2015-01-20 ENCOUNTER — Encounter: Payer: Self-pay | Admitting: Internal Medicine

## 2015-01-20 VITALS — BP 124/76 | HR 100 | Temp 98.6°F | Wt 182.5 lb

## 2015-01-20 DIAGNOSIS — J029 Acute pharyngitis, unspecified: Secondary | ICD-10-CM

## 2015-01-20 DIAGNOSIS — J040 Acute laryngitis: Secondary | ICD-10-CM

## 2015-01-20 LAB — POCT RAPID STREP A (OFFICE): RAPID STREP A SCREEN: NEGATIVE

## 2015-01-20 NOTE — Patient Instructions (Signed)
Take Advil for pain. Call if not better by Monday.

## 2015-01-20 NOTE — Progress Notes (Signed)
   Subjective:    Patient ID: Mikel Cella, female    DOB: 02-06-1986, 29 y.o.   MRN: [id number]  HPI Has been around sick nephew recently. Last had similar illness in November when she was around him as well. Now has laryngitis and sore throat. No fever or shaking chills. No significant cough. No headache. No myalgias. Has lost her voice.    Review of Systems     Objective:   Physical Exam  Cannot really speak above a whisper. Pharynx is red. Rapid strep screen negative. Right TM is full but not red. Left TM clear. Neck is supple without significant adenopathy. Chest is clear to auscultation without rales or wheezing      Assessment & Plan:  Non-strep pharyngitis  Laryngitis-likely viral  Plan: She does not want to be on antibiotics at this point in time. She will call if not better by Monday. She will take Advil for sore throat pain.

## 2015-01-23 ENCOUNTER — Ambulatory Visit (INDEPENDENT_AMBULATORY_CARE_PROVIDER_SITE_OTHER): Payer: 59 | Admitting: Internal Medicine

## 2015-01-23 ENCOUNTER — Encounter: Payer: Self-pay | Admitting: Internal Medicine

## 2015-01-23 VITALS — BP 124/72 | HR 106 | Temp 97.6°F | Wt 182.5 lb

## 2015-01-23 DIAGNOSIS — H6503 Acute serous otitis media, bilateral: Secondary | ICD-10-CM

## 2015-01-23 DIAGNOSIS — J0100 Acute maxillary sinusitis, unspecified: Secondary | ICD-10-CM

## 2015-01-23 MED ORDER — CLARITHROMYCIN 500 MG PO TABS: 500.0000 mg | ORAL_TABLET | Freq: Two times a day (BID) | ORAL | Status: DC

## 2015-01-23 NOTE — Patient Instructions (Addendum)
Take Biaxin twice daily with food for 10 days.

## 2015-01-23 NOTE — Progress Notes (Signed)
   Subjective:    Patient ID: Brenda Roy, female    DOB: 1985-12-25, 29 y.o.   MRN: [id number]  HPI  She was here on February 19 with laryngitis and upper respiratory infection. She did not want to be placed on antibiotics. Was told to rest and drink plenty of fluids and call back if not better by today. Now complaining of ear pain particularly right ear. Still has some sore throat but it has improved. Her voice has returned. She has  history of Epstein-Barr virus positivity. No fever or shaking chills. Some slight discolored sputum. Some slight bloody nasal discharge when she blows her nose. Not much cough.    Review of Systems     Objective:   Physical Exam  She sounds nasally congested. TMs are full bilaterally but not red. Pharynx very slightly injected. Neck is supple without significant adenopathy. Chest clear to auscultation without rales or wheezing      Assessment & Plan:  Sinusitis  Bilateral serous otitis media  Laryngitis-resolved  Plan: Biaxin 500 mg twice daily for 10 days.

## 2015-11-29 ENCOUNTER — Ambulatory Visit (INDEPENDENT_AMBULATORY_CARE_PROVIDER_SITE_OTHER): Payer: 59 | Admitting: Internal Medicine

## 2015-11-29 ENCOUNTER — Encounter: Payer: Self-pay | Admitting: Internal Medicine

## 2015-11-29 VITALS — BP 112/64 | HR 98 | Temp 98.1°F | Resp 20 | Ht 65.0 in | Wt 197.0 lb

## 2015-11-29 DIAGNOSIS — R21 Rash and other nonspecific skin eruption: Secondary | ICD-10-CM | POA: Diagnosis not present

## 2015-11-29 DIAGNOSIS — L299 Pruritus, unspecified: Secondary | ICD-10-CM

## 2015-11-29 MED ORDER — METHYLPREDNISOLONE ACETATE 80 MG/ML IJ SUSP
80.0000 mg | Freq: Once | INTRAMUSCULAR | Status: AC
  Administered 2015-11-29: 80 mg via INTRAMUSCULAR

## 2015-11-29 NOTE — Progress Notes (Signed)
   Subjective:    Patient ID: Brenda Roy, female    DOB: 21-Mar-1986, 29 y.o.   MRN: [id number]  HPI Patient was visiting brother recently who has baby. Baby was not ill. Patient developed itchy rash that started initially on anterior chest progress to arms and legs. She thought it might be hives but what she describes was not hive like lesions. Brother did have different soap in his home for bathing. Patient has no respiratory infection. No fever. Denies being under stress. Has one more year of graduate school. Says she'll be taking the left wrist. Has completed all required courses.    Review of Systems     Objective:   Physical Exam  She has fine macular erythematous rash volar aspect of arms. Anterior thighs have similar rash. No rash on back. A few scattered lesions on anterior chest and abdomen. These are not hives.      Assessment & Plan:  Nonspecific dermatitis. Pruritic rash  Plan: Depo-Medrol 80 mg IM. Use Sarna lotion when necessary.

## 2015-11-29 NOTE — Patient Instructions (Addendum)
Depomedrol given.  Use Sarna lotion for itching. Return as needed.

## 2016-08-26 ENCOUNTER — Telehealth: Payer: Self-pay | Admitting: Internal Medicine

## 2016-08-26 NOTE — Telephone Encounter (Signed)
Pt informed, states understanding, has no other questions.

## 2016-08-26 NOTE — Telephone Encounter (Signed)
Was stung by a wasp 9/3 on the ankle.  She took Benadryl and Ibuprofen and put ice on it at the time of the sting.  She is still complaining of pain when she touches the area.  Says when she's walking, it's sort of "tight" around the ankle.  Doesn't say that she had an allergy to wasps.    Just wants to know how long she can expect to have this pain to the area when she touches it?  States it has been 3 weeks and it seems weird that it still hurts when she touches the ankle.    Any advice for her?    Best contact #:  [phone]

## 2016-08-26 NOTE — Telephone Encounter (Signed)
These stings can be uncomfortable swollen and itchy for some time. She may try some OTC cortisone cream. It should get a better.

## 2016-11-01 ENCOUNTER — Ambulatory Visit (INDEPENDENT_AMBULATORY_CARE_PROVIDER_SITE_OTHER): Payer: BLUE CROSS/BLUE SHIELD | Admitting: Internal Medicine

## 2016-11-01 ENCOUNTER — Encounter: Payer: Self-pay | Admitting: Internal Medicine

## 2016-11-01 VITALS — BP 132/72 | HR 96 | Temp 99.0°F | Wt 207.0 lb

## 2016-11-01 DIAGNOSIS — T753XXA Motion sickness, initial encounter: Secondary | ICD-10-CM

## 2016-11-01 MED ORDER — ONDANSETRON HCL 4 MG PO TABS: 4.0000 mg | ORAL_TABLET | Freq: Three times a day (TID) | ORAL | 2 refills | Status: DC | PRN

## 2016-11-01 NOTE — Patient Instructions (Signed)
Zofran 4 mg tablet one by mouth every 8 hours when necessary motion sickness

## 2016-11-01 NOTE — Progress Notes (Signed)
   Subjective:    Patient ID: Mikel Cella, female    DOB: 1986/07/14, 30 y.o.   MRN: [id number]  HPI 30 year old Female with long-standing history of motion sickness since childhood in today discussed management of motion sickness. 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 She needs to have something available for motion sickness for flying and bus trips etc.  Interestingly, she also has a history of migraine headaches which sometimes associated with motion sickness.  She is completing a Masters degree in the [employer] this coming Spring.  She's not been able to exercise as much and she's gained some weight. She was 182 pounds in February 2016 and now weighs 207 pounds.    Review of Systems see above     Objective:   Physical Exam She was not examined today. I spent 15 minutes speaking with her about management of motion sickness. I'm going to prescribe Zofran 4 mg tablets for her to take on upcoming trip.       Assessment & Plan:  Motion sickness  Plan: Zofran 4 mg tablets 1 by mouth every 8 hours when necessary nausea

## 2017-05-23 ENCOUNTER — Other Ambulatory Visit: Payer: BLUE CROSS/BLUE SHIELD | Admitting: Internal Medicine

## 2017-05-23 DIAGNOSIS — Z130 Encounter for screening for diseases of the blood and blood-forming organs and certain disorders involving the immune mechanism: Secondary | ICD-10-CM

## 2017-05-23 DIAGNOSIS — Z1321 Encounter for screening for nutritional disorder: Secondary | ICD-10-CM

## 2017-05-23 DIAGNOSIS — Z0000 Encounter for general adult medical examination without abnormal findings: Secondary | ICD-10-CM

## 2017-05-23 DIAGNOSIS — Z1329 Encounter for screening for other suspected endocrine disorder: Secondary | ICD-10-CM

## 2017-05-23 DIAGNOSIS — Z13220 Encounter for screening for lipoid disorders: Secondary | ICD-10-CM

## 2017-05-23 LAB — COMPLETE METABOLIC PANEL WITH GFR
ALBUMIN: 4 g/dL (ref 3.6–5.1)
ALK PHOS: 56 U/L (ref 33–115)
ALT: 15 U/L (ref 6–29)
AST: 15 U/L (ref 10–30)
BILIRUBIN TOTAL: 0.5 mg/dL (ref 0.2–1.2)
BUN: 11 mg/dL (ref 7–25)
CO2: 20 mmol/L (ref 20–31)
CREATININE: 0.88 mg/dL (ref 0.50–1.10)
Calcium: 9.3 mg/dL (ref 8.6–10.2)
Chloride: 103 mmol/L (ref 98–110)
GFR, Est African American: >89 mL/min (ref >=60)
GFR, Est Non African American: 88 mL/min (ref >=60)
GLUCOSE: 80 mg/dL (ref 65–99)
Potassium: 4.2 mmol/L (ref 3.5–5.3)
SODIUM: 135 mmol/L (ref 135–146)
TOTAL PROTEIN: 7 g/dL (ref 6.1–8.1)

## 2017-05-23 LAB — LIPID PANEL
Cholesterol: 188 mg/dL (ref <200)
HDL: 48 mg/dL — AB (ref >50)
LDL CALC: 119 mg/dL — AB (ref <100)
Total CHOL/HDL Ratio: 3.9 Ratio (ref <5.0)
Triglycerides: 103 mg/dL (ref <150)
VLDL: 21 mg/dL (ref <30)

## 2017-05-23 LAB — CBC WITH DIFFERENTIAL/PLATELET
BASOS ABS: 0 {cells}/uL (ref 0–200)
Basophils Relative: 0 %
EOS PCT: 2 %
Eosinophils Absolute: 108 cells/uL (ref 15–500)
HCT: 39.4 % (ref 35.0–45.0)
HEMOGLOBIN: 12.7 g/dL (ref 11.7–15.5)
LYMPHS ABS: 2052 {cells}/uL (ref 850–3900)
Lymphocytes Relative: 38 %
MCH: 27.3 pg (ref 27.0–33.0)
MCHC: 32.2 g/dL (ref 32.0–36.0)
MCV: 84.5 fL (ref 80.0–100.0)
MONOS PCT: 6 %
MPV: 10.5 fL (ref 7.5–12.5)
Monocytes Absolute: 324 cells/uL (ref 200–950)
NEUTROS ABS: 2916 {cells}/uL (ref 1500–7800)
Neutrophils Relative %: 54 %
PLATELETS: 246 10*3/uL (ref 140–400)
RBC: 4.66 MIL/uL (ref 3.80–5.10)
RDW: 13.5 % (ref 11.0–15.0)
WBC: 5.4 10*3/uL (ref 3.8–10.8)

## 2017-05-24 LAB — VITAMIN D 25 HYDROXY (VIT D DEFICIENCY, FRACTURES): VIT D 25 HYDROXY: 25 ng/mL — AB (ref 30–100)

## 2017-05-24 LAB — TSH: TSH: 2.07 m[IU]/L

## 2017-05-30 ENCOUNTER — Ambulatory Visit (INDEPENDENT_AMBULATORY_CARE_PROVIDER_SITE_OTHER): Payer: BLUE CROSS/BLUE SHIELD | Admitting: Internal Medicine

## 2017-05-30 ENCOUNTER — Encounter: Payer: Self-pay | Admitting: Internal Medicine

## 2017-05-30 VITALS — BP 118/70 | HR 77 | Temp 98.7°F | Ht 65.5 in | Wt 203.0 lb

## 2017-05-30 DIAGNOSIS — Z0000 Encounter for general adult medical examination without abnormal findings: Secondary | ICD-10-CM

## 2017-05-30 DIAGNOSIS — E7800 Pure hypercholesterolemia, unspecified: Secondary | ICD-10-CM | POA: Diagnosis not present

## 2017-05-30 DIAGNOSIS — Z6833 Body mass index (BMI) 33.0-33.9, adult: Secondary | ICD-10-CM | POA: Diagnosis not present

## 2017-05-30 LAB — POCT URINALYSIS DIPSTICK
Bilirubin, UA: NEGATIVE
Blood, UA: NEGATIVE
Glucose, UA: NEGATIVE
KETONES UA: NEGATIVE
LEUKOCYTES UA: NEGATIVE
NITRITE UA: NEGATIVE
PROTEIN UA: NEGATIVE
Spec Grav, UA: 1.02 (ref 1.010–1.025)
Urobilinogen, UA: 0.2 E.U./dL
pH, UA: 6.5 (ref 5.0–8.0)

## 2017-05-30 NOTE — Patient Instructions (Signed)
Continue diet , exercise and weight loss efforts. It was pleasure to see you today. Return in one year or as needed.

## 2017-05-30 NOTE — Progress Notes (Signed)
   Subjective:    Patient ID: Brenda Roy, female    DOB: May 22, 1986, 31 y.o.   MRN: [id number]  HPI P  leasant 31 year old female in today for health maintenance exam. 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  History of migraine headaches that she manages symptomatically from time to time. Seem to be associated with estrogen withdrawal from oral contraceptives but now is on different oral contraceptive per Dr. Evlyn Kanner, GYN and is hoping to have less migraine headaches. Has had Gardasil series. History of fibrocystic breast disease. Had mass in left breast removed while in [place]. Used to take Amitiza for chronic constipation but no longer needs to do that. Bowel movements have been regular.  Sulfa causes a rash  History of dysmenorrhea  Tetanus immunization September 2012.  Social history: She is single. Does not smoke. Parents reside in [place]. Does not consume alcohol.  Family history: Father with history of hypertension. One brother in good health. However he has GE reflux. Mother in good health with history of GE reflux. Paternal uncle with history of esophageal cancer from Barrett's esophagus who died at age 37. Patient is asking about screening for Barrett's esophagus. She has no GE reflux symptoms.    Review of Systems no new complaints. Patient says she lost 20 pounds a few months back and gained it all back.     Objective:   Physical Exam  Constitutional: She is oriented to person, place, and time. She appears well-developed and well-nourished. No distress.  HENT:  Head: Normocephalic and atraumatic.  Right Ear: External ear normal.  Left Ear: External ear normal.  Mouth/Throat: Oropharynx is clear and moist. No oropharyngeal exudate.  Eyes: Conjunctivae and EOM are normal. Pupils are equal, round, and reactive to light. Right eye exhibits no discharge.  Left eye exhibits no discharge.  Neck: Neck supple. No JVD present. No thyromegaly present.  Cardiovascular: Normal rate, regular rhythm, normal heart sounds and intact distal pulses.   No murmur heard. Pulmonary/Chest: Effort normal and breath sounds normal. No respiratory distress. She has no wheezes. She has no rales.  Abdominal: Soft. Bowel sounds are normal. She exhibits no distension and no mass. There is no tenderness. There is no rebound and no guarding.  Genitourinary:  Genitourinary Comments: Deferred to Dr. Evlyn Kanner  Musculoskeletal: She exhibits no edema.  Lymphadenopathy:    She has no cervical adenopathy.  Neurological: She is alert and oriented to person, place, and time. She has normal reflexes. No cranial nerve deficit. Coordination normal.  Skin: Skin is warm and dry. No rash noted. She is not diaphoretic.  Psychiatric: She has a normal mood and affect. Her behavior is normal. Judgment and thought content normal.  Vitals reviewed.         Assessment & Plan:  Normal health maintenance exam  BMI elevated at 33.27-TSH is normal  Lab work reviewed and is within normal limits  Vitamin D deficiency-level is 25. Needs take 2000 units vitamin D 3 daily  Elevated LDL cholesterol-now 119 and previously was 96 3 years ago. Recheck in one year. Advise diet exercise and weight loss.  Plan: Work on diet exercise and weight loss. Return in one year or as needed.

## 2017-10-16 DIAGNOSIS — H16223 Keratoconjunctivitis sicca, not specified as Sjogren's, bilateral: Secondary | ICD-10-CM | POA: Diagnosis not present

## 2017-10-16 DIAGNOSIS — H10413 Chronic giant papillary conjunctivitis, bilateral: Secondary | ICD-10-CM | POA: Diagnosis not present

## 2017-10-18 DIAGNOSIS — Z23 Encounter for immunization: Secondary | ICD-10-CM | POA: Diagnosis not present

## 2018-06-17 DIAGNOSIS — Z01419 Encounter for gynecological examination (general) (routine) without abnormal findings: Secondary | ICD-10-CM | POA: Diagnosis not present

## 2018-07-13 ENCOUNTER — Encounter: Payer: Self-pay | Admitting: Internal Medicine

## 2018-07-13 ENCOUNTER — Ambulatory Visit: Payer: 59 | Admitting: Internal Medicine

## 2018-07-13 ENCOUNTER — Ambulatory Visit
Admission: RE | Admit: 2018-07-13 | Discharge: 2018-07-13 | Disposition: A | Discharge status: Home / Self Care | Payer: 59 | Source: Ambulatory Visit | Attending: Internal Medicine | Admitting: Internal Medicine

## 2018-07-13 VITALS — BP 100/80 | HR 80 | Ht 65.5 in | Wt 203.0 lb

## 2018-07-13 DIAGNOSIS — K5904 Chronic idiopathic constipation: Secondary | ICD-10-CM

## 2018-07-13 DIAGNOSIS — K5900 Constipation, unspecified: Secondary | ICD-10-CM

## 2018-07-13 MED ORDER — LUBIPROSTONE 8 MCG PO CAPS: 8.0000 ug | ORAL_CAPSULE | Freq: Two times a day (BID) | ORAL | 5 refills | Status: DC

## 2018-07-13 NOTE — Progress Notes (Signed)
   Subjective:    Patient ID: Mikel Cella, female    DOB: 1986/11/03, 32 y.o.   MRN: [id number]  32 year old female with long-standing history of constipation.  In 2011 she was seen at [facility] for constipation.  She had an abdominal ultrasound which was normal.  She had CT of the abdomen and pelvis with contrast showing moderate fecal residue noted in the colon.  No evidence of obstruction.  No evidence of hernia or suspicious lesions.  She was placed on         Review of Systems see above bowel movements are uncomfortable and occur maybe once or twice a week     Objective:   Physical Exam  Abdomen is soft nondistended without hepatosplenomegaly masses or tenderness at the present time.  Stool was guaiac negative.      Assessment & Plan:  Functional constipation with previous response to Amitiza  Plan: She will have KUB of abdomen.  Prescribed amities at 8 mcg twice daily with food.  Addendum: KUB shows mild increased stool burden in right colon without obstruction.  We have had to do prioritization to get amities approved but it has been approved.  25 minutes spent with patient and reviewing records from [place]

## 2018-07-14 LAB — HEMOCCULT GUIAC POC 1CARD (OFFICE): FECAL OCCULT BLD: NEGATIVE

## 2018-07-29 NOTE — Patient Instructions (Signed)
Trial of amities at 8 mcg twice daily.  Have KUB of abdomen today.

## 2018-08-13 ENCOUNTER — Encounter (INDEPENDENT_AMBULATORY_CARE_PROVIDER_SITE_OTHER): Payer: Self-pay | Admitting: Orthopedic Surgery

## 2018-08-13 ENCOUNTER — Ambulatory Visit (INDEPENDENT_AMBULATORY_CARE_PROVIDER_SITE_OTHER): Payer: 59 | Admitting: Orthopedic Surgery

## 2018-08-13 ENCOUNTER — Ambulatory Visit (INDEPENDENT_AMBULATORY_CARE_PROVIDER_SITE_OTHER): Payer: Self-pay

## 2018-08-13 DIAGNOSIS — M79645 Pain in left finger(s): Secondary | ICD-10-CM | POA: Diagnosis not present

## 2018-08-14 ENCOUNTER — Encounter (INDEPENDENT_AMBULATORY_CARE_PROVIDER_SITE_OTHER): Payer: Self-pay | Admitting: Orthopedic Surgery

## 2018-08-14 NOTE — Progress Notes (Signed)
Office Visit Note   Patient: Brenda Roy           Date of Birth: 1986-06-26           MRN: [id number] Visit Date: 08/13/2018 Requested by: Elby Showers, MD [id number] [place], [place] [id number] PCP: Elby Showers, MD  Subjective: Chief Complaint  Patient presents with  . Left Hand - Injury    HPI: Patient presents for evaluation of left hand pain.  She injured her left small finger 07/01/2018 when she was bringing her arm up and she sustained an ulnar deviated force on the radial aspect of that small finger.  No prior injury to the finger.  She developed pain at the PIP joint.  She reports diminished power grip particularly when lifting dumbbells.  She did tape it for several weeks.              ROS: All systems reviewed are negative as they relate to the chief complaint within the history of present illness.  Patient denies  fevers or chills.   Assessment & Plan: Visit Diagnoses:  1. Pain in left finger(s)     Plan: Impression is self-limited ligamentous injury to the left small finger without evidence of ongoing instability.  She may have a little bit of laxity at the PIP joint to ulnar deviated stress but its at most 1 mm more than the right-hand side.  Range of motion is full.  Extensor mechanism is intact in the small finger.  This should be a self-limited problem.  I will see her back as needed.  Follow-Up Instructions: Return if symptoms worsen or fail to improve.   Orders:  Orders Placed This Encounter  Procedures  . XR Finger Little Left   No orders of the defined types were placed in this encounter.     Procedures: No procedures performed   Clinical Data: No additional findings.  Objective: Vital Signs: There were no vitals taken for this visit.  Physical Exam:   Constitutional: Patient appears well-developed HEENT:  Head: Normocephalic Eyes:EOM are normal Neck: Normal range of motion Cardiovascular: Normal rate Pulmonary/chest:  Effort normal Neurologic: Patient is alert Skin: Skin is warm Psychiatric: Patient has normal mood and affect    Ortho Exam: Ortho exam demonstrates full active and passive range of motion of that small finger.  Collaterals are stable.  There is some slight tenderness over the medial radial aspect of that PIP joint of the small finger.  Alignment of the hand and fingers are normal and intact.  Specialty Comments:  No specialty comments available.  Imaging: Xr Finger Little Left  Result Date: 08/14/2018 AP lateral oblique left small finger reviewed.  Bony alignment normal.  No evidence of acute bony fracture or injury.  Normal left finger    PMFS History: Patient Active Problem List   Diagnosis Date Noted  . BMI 33.0-33.9,adult 05/30/2017  . Elevated LDL cholesterol level 05/30/2017  . History of urticaria 04/04/2013  . History of irritable bowel syndrome 08/26/2011  . Fibrocystic breast disease 08/26/2011   Past Medical History:  Diagnosis Date  . Fibroadenoma of breast     Family History  Problem Relation Age of Onset  . Hypertension Father     Past Surgical History:  Procedure Laterality Date  . BREAST LUMPECTOMY  12/2010  . MASTECTOMY, PARTIAL  12/2010   Social History   Occupational History  . Not on file  Tobacco Use  . Smoking status: Never Smoker  .  Smokeless tobacco: Never Used  Substance and Sexual Activity  . Alcohol use: No  . Drug use: No  . Sexual activity: Not on file

## 2018-09-06 DIAGNOSIS — Z23 Encounter for immunization: Secondary | ICD-10-CM | POA: Diagnosis not present

## 2018-10-01 ENCOUNTER — Encounter: Payer: Self-pay | Admitting: Internal Medicine

## 2018-10-01 ENCOUNTER — Ambulatory Visit: Payer: 59 | Admitting: Internal Medicine

## 2018-10-01 VITALS — BP 120/80 | HR 90 | Temp 98.6°F | Ht 65.5 in | Wt 185.0 lb

## 2018-10-01 DIAGNOSIS — H6503 Acute serous otitis media, bilateral: Secondary | ICD-10-CM

## 2018-10-01 DIAGNOSIS — K5904 Chronic idiopathic constipation: Secondary | ICD-10-CM | POA: Diagnosis not present

## 2018-10-01 DIAGNOSIS — J029 Acute pharyngitis, unspecified: Secondary | ICD-10-CM

## 2018-10-01 LAB — POCT RAPID STREP A (OFFICE): RAPID STREP A SCREEN: NEGATIVE

## 2018-10-01 MED ORDER — CLARITHROMYCIN 500 MG PO TABS: 250.0000 mg | ORAL_TABLET | Freq: Two times a day (BID) | ORAL | 0 refills | Status: DC

## 2018-10-01 NOTE — Progress Notes (Signed)
   Subjective:    Patient ID: Brenda Roy, female    DOB: 05/23/1986, 32 y.o.   MRN: [id number]  HPI Onset sore throat, malaise, fatigue sinus pressure and ear pain about a week ago. No cough. No chills.  No documented fever.  Has not had respiratory infection since 2016.  Pain was in left ear.  Patient is concerned about strep throat.  With regard to functional constipation she is doing well with Amitiza.    Review of Systems see above     Objective:   Physical Exam Pharynx is slightly injected without exudate.  Rapid strep screen negative.  Both TMs are full the left being fuller than the right and they are both dull but not red.  Neck is supple.  Chest clear to auscultation without rales or wheezing.      Assessment & Plan:  Acute non-strep pharyngitis  Bilateral serous otitis media  Plan: The ears are significantly dull and I think she would benefit from antibiotics.  I am going to prescribe Biaxin 250 mg twice daily for 10 days.  Rest and drink plenty of fluids.  Tylenol if needed for sore throat pain.  She has been taking Zyrtec and decongestant.  15 minutes spent with patient

## 2018-10-01 NOTE — Patient Instructions (Signed)
Biaxin 250 mg twice daily for 10 days.  Rest and drink plenty of fluids.  Tylenol if needed for sore throat pain.  Continue decongestant.

## 2018-11-18 DIAGNOSIS — N921 Excessive and frequent menstruation with irregular cycle: Secondary | ICD-10-CM | POA: Diagnosis not present

## 2019-01-08 ENCOUNTER — Encounter: Payer: Self-pay | Admitting: Internal Medicine

## 2019-01-08 ENCOUNTER — Ambulatory Visit: Payer: 59 | Admitting: Internal Medicine

## 2019-01-08 VITALS — BP 130/72 | HR 72 | Ht 65.5 in | Wt 169.0 lb

## 2019-01-08 DIAGNOSIS — K5904 Chronic idiopathic constipation: Secondary | ICD-10-CM | POA: Diagnosis not present

## 2019-01-08 DIAGNOSIS — R110 Nausea: Secondary | ICD-10-CM

## 2019-01-08 DIAGNOSIS — K219 Gastro-esophageal reflux disease without esophagitis: Secondary | ICD-10-CM

## 2019-01-08 MED ORDER — SUCRALFATE 1 GM/10ML PO SUSP: 1.0000 g | Freq: Four times a day (QID) | ORAL | 0 refills | Status: DC

## 2019-01-11 LAB — H. PYLORI BREATH TEST: H. pylori Breath Test: NOT DETECTED

## 2019-01-30 DIAGNOSIS — K5904 Chronic idiopathic constipation: Secondary | ICD-10-CM | POA: Insufficient documentation

## 2019-01-30 NOTE — Progress Notes (Signed)
   Subjective:    Patient ID: Brenda Roy, female    DOB: 1986-06-10, 33 y.o.   MRN: [id number]  HPI Over the past few days of had considerable issues with continuous heartburn and reflux symptoms.  She is not sure what triggered this.  History of functional constipation.  Says she is not constipated at the present time.  No fever or chills.  No diarrhea. Takes amities a for functional constipation.  Does not drink a lot of caffeine.  Review of Systems see above- has felt nauseated     Objective:   Physical Exam  Abdomen bowel sounds are active.  No hepatosplenomegaly masses or significant tenderness.  Looks to be in mild distress.  Blood pressure 130/72.  BMI 27.70.  Pulse 72.  Pulse oximetry 98%.  Chest clear.  Cardiac exam regular rate and rhythm.      Assessment & Plan:  GE reflux causing esophagitis  Functional constipation-currently on Amitiza  Plan: She will have H. pylori testing.  Was given GI cocktail and will be placed on Carafate 10 cc p.o. 4 times a day.  Take PPI.  Call with progress report.  Plan: 30 cc p.o. GI cocktail given in office today.  Take Carafate 4 times a day.  Take PPI.  Call if not improving in 48 hours or sooner if worse  Addendum: H. pylori testing is negative

## 2019-01-30 NOTE — Patient Instructions (Signed)
Take Carafate 10 cc 4 times daily.  Take PPI.  Eat lightly.  Call if not improving.

## 2019-02-26 ENCOUNTER — Other Ambulatory Visit: Payer: Self-pay

## 2019-02-26 MED ORDER — LUBIPROSTONE 8 MCG PO CAPS: 8.0000 ug | ORAL_CAPSULE | Freq: Two times a day (BID) | ORAL | 5 refills | Status: DC

## 2019-04-28 ENCOUNTER — Emergency Department (HOSPITAL_COMMUNITY): Payer: 59

## 2019-04-28 ENCOUNTER — Emergency Department (HOSPITAL_COMMUNITY)
Admission: EM | Admit: 2019-04-28 | Discharge: 2019-04-29 | Disposition: A | Discharge status: Home / Self Care | Payer: 59 | Attending: Emergency Medicine | Admitting: Emergency Medicine

## 2019-04-28 ENCOUNTER — Encounter (HOSPITAL_COMMUNITY): Payer: Self-pay | Admitting: Emergency Medicine

## 2019-04-28 ENCOUNTER — Other Ambulatory Visit: Payer: Self-pay

## 2019-04-28 DIAGNOSIS — M79602 Pain in left arm: Secondary | ICD-10-CM

## 2019-04-28 DIAGNOSIS — Z79899 Other long term (current) drug therapy: Secondary | ICD-10-CM | POA: Diagnosis not present

## 2019-04-28 DIAGNOSIS — Y93K1 Activity, walking an animal: Secondary | ICD-10-CM | POA: Insufficient documentation

## 2019-04-28 DIAGNOSIS — Y929 Unspecified place or not applicable: Secondary | ICD-10-CM | POA: Diagnosis not present

## 2019-04-28 DIAGNOSIS — W108XXA Fall (on) (from) other stairs and steps, initial encounter: Secondary | ICD-10-CM | POA: Insufficient documentation

## 2019-04-28 DIAGNOSIS — M79632 Pain in left forearm: Secondary | ICD-10-CM | POA: Insufficient documentation

## 2019-04-28 DIAGNOSIS — Y999 Unspecified external cause status: Secondary | ICD-10-CM | POA: Insufficient documentation

## 2019-04-28 MED ORDER — ACETAMINOPHEN 325 MG PO TABS
650.0000 mg | ORAL_TABLET | Freq: Once | ORAL | Status: AC
  Administered 2019-04-29: 650 mg via ORAL
  Filled 2019-04-28: qty 2

## 2019-04-28 NOTE — ED Triage Notes (Signed)
Patient c/o left arm pain after fall walking dog. Swelling noted to arm.

## 2019-04-28 NOTE — Discharge Instructions (Addendum)
1. Medications: alternate ibuprofen and tylenol for pain control, usual home medications 2. Treatment: rest, ice, elevate, drink plenty of fluids, gentle stretching 3. Follow Up: Please follow up with orthopedics as directed or your PCP in 1 week if no improvement for discussion of your diagnoses and further evaluation after today's visit; if you do not have a primary care doctor use the resource guide provided to find one; Please return to the ER for worsening symptoms or other concerns

## 2019-04-28 NOTE — ED Provider Notes (Signed)
[facility] DEPT Provider Note   CSN: [id number] Arrival date & time: 04/28/19  2222    History   Chief Complaint Chief Complaint  Patient presents with  . Arm Pain    HPI Brenda Roy is a 33 y.o. female with a PMH of Fibroadenoma of breast, IBS, and constipation presenting with constant left forearm pain onset at 9:30pm today after a fall. Patient reports her dog pulled her arm into the stairs while she was walking her dog. Patient describes pain as throbbing and burning sensation. Patient states she did not take any medications prior to arrival. Patient denies hitting head or LOC. Patient denies wounds or bleeding. Patient states she is right hand dominant. Patient denies previous injuries to this arm. Patient denies numbness, paresthesias, or weakness. Patient denies fever, chills, nausea, vomiting, or abdominal pain. Patient denies wrist, hand, elbow, or shoulder pain.      HPI  Past Medical History:  Diagnosis Date  . Fibroadenoma of breast     Patient Active Problem List   Diagnosis Date Noted  . Functional constipation 01/30/2019  . BMI 33.0-33.9,adult 05/30/2017  . Elevated LDL cholesterol level 05/30/2017  . History of urticaria 04/04/2013  . History of irritable bowel syndrome 08/26/2011  . Fibrocystic breast disease 08/26/2011    Past Surgical History:  Procedure Laterality Date  . BREAST LUMPECTOMY  12/2010  . MASTECTOMY, PARTIAL  12/2010     OB History   No obstetric history on file.      Home Medications    Prior to Admission medications   Medication Sig Start Date End Date Taking? Authorizing Provider  cholecalciferol (VITAMIN D3) 25 MCG (1000 UT) tablet Take 2,000 Units by mouth daily.    [provider]  Theodis Shove 91-Day (CAMRESE LO PO) Take by mouth daily.    Alden Hipp, MD  lubiprostone (AMITIZA) 8 MCG capsule Take 1 capsule (8 mcg total) by mouth 2 (two) times daily with a meal.  02/26/19   Baxley, Cresenciano Lick, MD  Multiple Vitamin (MULTIVITAMIN) tablet Take 1 tablet by mouth daily.    [provider]  sucralfate (CARAFATE) 1 GM/10ML suspension Take 10 mLs (1 g total) by mouth 4 (four) times daily. TAKE IT ON AN EMPTY STOMACH 01/08/19   Elby Showers, MD    Family History Family History  Problem Relation Age of Onset  . Hypertension Father     Social History Social History   Tobacco Use  . Smoking status: Never Smoker  . Smokeless tobacco: Never Used  Substance Use Topics  . Alcohol use: No  . Drug use: No     Allergies   Minocycline and Sulfa antibiotics   Review of Systems Review of Systems  Constitutional: Negative for activity change, chills, diaphoresis and fever.  HENT: Negative for congestion and rhinorrhea.   Respiratory: Negative for shortness of breath.   Cardiovascular: Negative for chest pain.  Gastrointestinal: Negative for abdominal pain, diarrhea, nausea and vomiting.  Musculoskeletal: Negative for arthralgias, back pain, gait problem, joint swelling, myalgias, neck pain and neck stiffness.       Pt reports pain and edema in left forearm.  Skin: Negative for color change and wound.  Allergic/Immunologic: Negative for immunocompromised state.  Neurological: Negative for weakness and numbness.  Hematological: Does not bruise/bleed easily.   Physical Exam Updated Vital Signs BP (!) 147/84 (BP Location: Right Arm)   Pulse 87   Temp 98.1 F (36.7 C) (Oral)   Resp  20   LMP 03/24/2019   SpO2 100%   Physical Exam Vitals signs and nursing note reviewed.  Constitutional:      General: She is not in acute distress.    Appearance: She is well-developed. She is not diaphoretic.  HENT:     Head: Normocephalic and atraumatic.  Neck:     Musculoskeletal: Normal range of motion.  Cardiovascular:     Rate and Rhythm: Normal rate and regular rhythm.     Heart sounds: Normal heart sounds. No murmur. No friction rub. No gallop.    Pulmonary:     Effort: Pulmonary effort is normal. No respiratory distress.     Breath sounds: Normal breath sounds. No wheezing or rales.  Abdominal:     Palpations: Abdomen is soft.     Tenderness: There is no abdominal tenderness.  Musculoskeletal:     Left shoulder: Normal. She exhibits normal range of motion, no tenderness and no bony tenderness.     Right elbow: Normal.She exhibits normal range of motion, no swelling, no effusion and no deformity.     Left elbow: Normal. She exhibits normal range of motion, no swelling, no effusion and no deformity.     Right wrist: She exhibits normal range of motion, no tenderness, no bony tenderness and no swelling.     Left wrist: She exhibits normal range of motion, no tenderness, no bony tenderness and no swelling.     Left upper arm: Normal. She exhibits no tenderness, no bony tenderness and no swelling.     Right forearm: Normal. She exhibits no tenderness, no bony tenderness, no swelling and no edema.     Left forearm: She exhibits tenderness, bony tenderness, swelling and edema. She exhibits no laceration.     Right hand: Normal. She exhibits normal range of motion, no tenderness and no bony tenderness. Normal sensation noted. Normal strength noted.     Left hand: Normal. She exhibits normal range of motion, no tenderness and no bony tenderness. Normal sensation noted. Normal strength noted.     Comments: Edema noted over volar aspect of left forearm. Tenderness to palpation of left forearm. 2+ radial pulses. Sensation intact. 5/5 strength in upper extremities with grip strength bilaterally.   Skin:    General: Skin is warm.     Findings: No erythema or rash.  Neurological:     Mental Status: She is alert.      ED Treatments / Results  Labs (all labs ordered are listed, but only abnormal results are displayed) Labs Reviewed - No data to display  EKG None  Radiology Dg Forearm Left  Result Date: 04/28/2019 CLINICAL DATA:  Recent  fall while walking dog with forearm pain, initial encounter EXAM: LEFT FOREARM - 2 VIEW COMPARISON:  None. FINDINGS: There is no evidence of fracture or other focal bone lesions. Soft tissues are unremarkable. IMPRESSION: No acute abnormality noted. Electronically Signed   By: Inez Catalina M.D.   On: 04/28/2019 23:41    Procedures Procedures (including critical care time)  Medications Ordered in ED Medications  acetaminophen (TYLENOL) tablet 650 mg (has no administration in time range)     Initial Impression / Assessment and Plan / ED Course  I have reviewed the triage vital signs and the nursing notes.  Pertinent labs & imaging results that were available during my care of the patient were reviewed by me and considered in my medical decision making (see chart for details).  Clinical Course as of May 27  [place] Apr 28, 2019  2345 No acute abnormality noted.  DG Forearm Left [AH]    Clinical Course User Index [AH] Arville Lime, PA-C       Patient presents with arm pain after a fall. Patient X-Ray negative for obvious fracture or dislocation. Pain managed in ED. Pt advised to follow up with orthopedics if symptoms persist for possibility of missed fracture diagnosis. Patient given ice and tylenol while in ED, conservative therapy recommended and discussed. Patient will be dc home & is agreeable with above plan.   Final Clinical Impressions(s) / ED Diagnoses   Final diagnoses:  Left arm pain    ED Discharge Orders    None       Julienne Kass 04/28/19 2351    Carmin Muskrat, MD 05/06/19 2224

## 2019-08-11 ENCOUNTER — Other Ambulatory Visit: Payer: Self-pay | Admitting: Internal Medicine

## 2019-09-30 ENCOUNTER — Other Ambulatory Visit: Payer: Self-pay

## 2019-09-30 ENCOUNTER — Other Ambulatory Visit: Payer: 59 | Admitting: Internal Medicine

## 2019-09-30 DIAGNOSIS — E7800 Pure hypercholesterolemia, unspecified: Secondary | ICD-10-CM

## 2019-09-30 DIAGNOSIS — Z1329 Encounter for screening for other suspected endocrine disorder: Secondary | ICD-10-CM

## 2019-09-30 DIAGNOSIS — K219 Gastro-esophageal reflux disease without esophagitis: Secondary | ICD-10-CM

## 2019-09-30 DIAGNOSIS — Z0000 Encounter for general adult medical examination without abnormal findings: Secondary | ICD-10-CM

## 2019-09-30 DIAGNOSIS — E559 Vitamin D deficiency, unspecified: Secondary | ICD-10-CM

## 2019-09-30 DIAGNOSIS — K5904 Chronic idiopathic constipation: Secondary | ICD-10-CM

## 2019-09-30 LAB — LIPID PANEL
Cholesterol: 206 mg/dL — ABNORMAL HIGH (ref <200)
HDL: 67 mg/dL (ref >=50)
LDL Cholesterol (Calc): 120 mg/dL (calc) — ABNORMAL HIGH
Non-HDL Cholesterol (Calc): 139 mg/dL (calc) — ABNORMAL HIGH (ref <130)
Total CHOL/HDL Ratio: 3.1 (calc) (ref <5.0)
Triglycerides: 86 mg/dL (ref <150)

## 2019-09-30 LAB — COMPLETE METABOLIC PANEL WITH GFR
AG Ratio: 1.5 (calc) (ref 1.0–2.5)
ALT: 16 U/L (ref 6–29)
AST: 17 U/L (ref 10–30)
Albumin: 4.1 g/dL (ref 3.6–5.1)
Alkaline phosphatase (APISO): 42 U/L (ref 31–125)
BUN: 16 mg/dL (ref 7–25)
CO2: 23 mmol/L (ref 20–32)
Calcium: 9.6 mg/dL (ref 8.6–10.2)
Chloride: 107 mmol/L (ref 98–110)
Creat: 0.9 mg/dL (ref 0.50–1.10)
GFR, Est African American: 97 mL/min/{1.73_m2} (ref >=60)
GFR, Est Non African American: 84 mL/min/{1.73_m2} (ref >=60)
Globulin: 2.7 g/dL (calc) (ref 1.9–3.7)
Glucose, Bld: 90 mg/dL (ref 65–99)
Potassium: 4.8 mmol/L (ref 3.5–5.3)
Sodium: 140 mmol/L (ref 135–146)
Total Bilirubin: 0.6 mg/dL (ref 0.2–1.2)
Total Protein: 6.8 g/dL (ref 6.1–8.1)

## 2019-09-30 LAB — CBC WITH DIFFERENTIAL/PLATELET
Absolute Monocytes: 313 cells/uL (ref 200–950)
Basophils Absolute: 7 cells/uL (ref 0–200)
Basophils Relative: 0.1 %
Eosinophils Absolute: 48 cells/uL (ref 15–500)
Eosinophils Relative: 0.7 %
HCT: 37.3 % (ref 35.0–45.0)
Hemoglobin: 11.9 g/dL (ref 11.7–15.5)
Lymphs Abs: 2502 cells/uL (ref 850–3900)
MCH: 26.3 pg — ABNORMAL LOW (ref 27.0–33.0)
MCHC: 31.9 g/dL — ABNORMAL LOW (ref 32.0–36.0)
MCV: 82.3 fL (ref 80.0–100.0)
MPV: 11 fL (ref 7.5–12.5)
Monocytes Relative: 4.6 %
Neutro Abs: 3930 cells/uL (ref 1500–7800)
Neutrophils Relative %: 57.8 %
Platelets: 279 10*3/uL (ref 140–400)
RBC: 4.53 10*6/uL (ref 3.80–5.10)
RDW: 14.6 % (ref 11.0–15.0)
Total Lymphocyte: 36.8 %
WBC: 6.8 10*3/uL (ref 3.8–10.8)

## 2019-09-30 LAB — VITAMIN D 25 HYDROXY (VIT D DEFICIENCY, FRACTURES): Vit D, 25-Hydroxy: 34 ng/mL (ref 30–100)

## 2019-09-30 LAB — TSH: TSH: 1.64 mIU/L

## 2019-10-04 ENCOUNTER — Other Ambulatory Visit: Payer: Self-pay

## 2019-10-04 ENCOUNTER — Encounter: Payer: Self-pay | Admitting: Internal Medicine

## 2019-10-04 ENCOUNTER — Ambulatory Visit (INDEPENDENT_AMBULATORY_CARE_PROVIDER_SITE_OTHER): Payer: 59 | Admitting: Internal Medicine

## 2019-10-04 VITALS — BP 120/70 | HR 74 | Temp 98.6°F | Ht 65.5 in | Wt 163.0 lb

## 2019-10-04 DIAGNOSIS — E7800 Pure hypercholesterolemia, unspecified: Secondary | ICD-10-CM | POA: Diagnosis not present

## 2019-10-04 DIAGNOSIS — Z8719 Personal history of other diseases of the digestive system: Secondary | ICD-10-CM

## 2019-10-04 DIAGNOSIS — Z0000 Encounter for general adult medical examination without abnormal findings: Secondary | ICD-10-CM | POA: Diagnosis not present

## 2019-10-04 LAB — POCT URINALYSIS DIPSTICK
Appearance: NEGATIVE
Bilirubin, UA: NEGATIVE
Blood, UA: NEGATIVE
Glucose, UA: NEGATIVE
Ketones, UA: NEGATIVE
Leukocytes, UA: NEGATIVE
Nitrite, UA: NEGATIVE
Odor: NEGATIVE
Protein, UA: NEGATIVE
Spec Grav, UA: 1.015 (ref 1.010–1.025)
Urobilinogen, UA: 0.2 E.U./dL
pH, UA: 6.5 (ref 5.0–8.0)

## 2019-10-04 NOTE — Progress Notes (Signed)
Subjective:    Patient ID: Brenda Roy, female    DOB: 08-Aug-1986, 33 y.o.   MRN: [id number]  HPI 33 year old Female for health maintenance exam and evaluation of medical issues.  She had fasting lab work recently showing total cholesterol of 236 and previously was 188 when checked 2 years ago the LDL cholesterol was 120 And previously was 119 when checked 2 years ago.  2 years ago, she had mild vitamin D deficiency at 78 but level is now normal at 34.  Her HDL was good at 67.  Triglycerides are normal.  TSH is normal.  CBC and C met within normal limits.  She has a history of functional constipation treated with Amitiza.  Had tetanus immunization in 2012.  Had flu vaccine in October.  In May she went to the emergency department after her dog pulled her arm into the stairs while she was walking the dog.  Had pain in left forearm.  Was noted to have edema of the left forearm but normal muscle strength.  X-ray showed no evidence of fracture.  Seen in February for GE reflux and esophagitis.  H. pylori testing was negative.  Was placed on Carafate and PPI and improved.  History of migraine headaches that she manages symptomatically.  Has had Gardasil series.  History of fibrocystic breast disease.  Has mass in left breast removed while in [place].  History of dysmenorrhea.  Sulfa causes a rash.  Social history: She is Development worker, international aid for [employer] .  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  Single.  Never married.  Does not smoke.  Does not consume alcohol.  Had tetanus immunization 2012.  Family history: Father with history of hypertension.  1 brother in good health but has GE reflux.  Mother in good health with GE reflux.  Paternal uncle with history of esophageal cancer from Barrett's esophagus and died at age 30.   Review of Systems no new complaints.     Objective:   Physical Exam Vitals signs reviewed.  Constitutional:      Appearance: Normal appearance.   HENT:     Head: Normocephalic.     Right Ear: Tympanic membrane normal.     Left Ear: Tympanic membrane normal.     Nose: Nose normal.     Mouth/Throat:     Mouth: Mucous membranes are moist.  Eyes:     General: No scleral icterus.       Right eye: No discharge.        Left eye: No discharge.  Neck:     Musculoskeletal: Neck supple.     Vascular: No carotid bruit.  Cardiovascular:     Rate and Rhythm: Normal rate and regular rhythm.     Heart sounds: Normal heart sounds. No murmur.  Pulmonary:     Effort: No respiratory distress.     Breath sounds: Normal breath sounds. No rales.  Abdominal:     Palpations: Abdomen is soft.  Genitourinary:    Comments: Deferred to GYN Musculoskeletal:     Right lower leg: No edema.     Left lower leg: No edema.  Lymphadenopathy:     Cervical: No cervical adenopathy.  Skin:    General: Skin is warm and dry.     Findings: No rash.  Neurological:     General: No focal deficit present.     Mental Status: She is alert.  Psychiatric:        Mood  and Affect: Mood normal.        Behavior: Behavior normal.        Thought Content: Thought content normal.        Judgment: Judgment normal.    VS reviewed.  Weight 163 pounds BMI 26.71.  In October 2019 she weighed 185 pounds.  Has done well with weight loss.         Assessment & Plan:  History of vitamin D deficiency but level is now normal with supplement daily  Elevated LDL cholesterol-continue diet and exercise efforts.  LDL is 120 and was 119 2 years ago  Congratulated on keeping weight off over the past few months with the pandemic.  Weight was 203 pounds in 2018.  History of functional constipation and has been treated with Amitiza  History of migraine headaches treated symptomatically  History of dysmenorrhea  Health maintenance-has received flu vaccine  Plan: Return in 1 to 2 years or as needed.  Has GYN physician.

## 2019-10-18 ENCOUNTER — Telehealth: Payer: Self-pay | Admitting: Internal Medicine

## 2019-10-18 MED ORDER — CLARITHROMYCIN 250 MG PO TABS: 250.0000 mg | ORAL_TABLET | Freq: Two times a day (BID) | ORAL | 0 refills | Status: DC

## 2019-10-18 NOTE — Telephone Encounter (Signed)
Please send in Rx for Biaxin 250 mg twice a day #20 and let her know

## 2019-10-18 NOTE — Telephone Encounter (Signed)
Brenda Roy [phone]  Brenda Roy called to say that her ears still feel like they have fluid in them.

## 2019-10-18 NOTE — Telephone Encounter (Signed)
Left detailed message.   

## 2019-10-30 NOTE — Patient Instructions (Signed)
It was a pleasure to see you today.  Congratulations on keeping weight off during the pandemic.  Return in 1 to 2 years or as needed and see GYN annually.

## 2019-10-31 LAB — LIPID PANEL

## 2019-10-31 LAB — CBC WITH DIFFERENTIAL/PLATELET

## 2019-10-31 LAB — VITAMIN D 25 HYDROXY (VIT D DEFICIENCY, FRACTURES)

## 2019-10-31 LAB — COMPLETE METABOLIC PANEL WITH GFR

## 2019-10-31 LAB — TSH

## 2019-11-01 ENCOUNTER — Telehealth: Payer: Self-pay

## 2019-11-01 NOTE — Telephone Encounter (Signed)
Please refer her to ENT

## 2019-11-01 NOTE — Telephone Encounter (Signed)
Faxed referral, office notes, demographics and labs to [facility]NT

## 2019-11-01 NOTE — Telephone Encounter (Signed)
Patient called said she finish the antibiotics that she was given for her ears but her ears are still hurting and has ringing in her ears she wants to know what to do?

## 2019-11-25 ENCOUNTER — Ambulatory Visit: Payer: 59 | Attending: Internal Medicine

## 2019-11-25 DIAGNOSIS — Z20822 Contact with and (suspected) exposure to covid-19: Secondary | ICD-10-CM

## 2019-11-26 LAB — NOVEL CORONAVIRUS, NAA: SARS-CoV-2, NAA: NOT DETECTED

## 2019-12-07 ENCOUNTER — Ambulatory Visit: Payer: 59 | Attending: Internal Medicine

## 2019-12-07 DIAGNOSIS — Z20822 Contact with and (suspected) exposure to covid-19: Secondary | ICD-10-CM

## 2019-12-08 ENCOUNTER — Other Ambulatory Visit: Payer: 59

## 2019-12-09 LAB — NOVEL CORONAVIRUS, NAA: SARS-CoV-2, NAA: NOT DETECTED

## 2020-01-27 ENCOUNTER — Other Ambulatory Visit: Payer: Self-pay

## 2020-01-27 ENCOUNTER — Ambulatory Visit: Payer: 59 | Attending: Family

## 2020-01-27 DIAGNOSIS — Z23 Encounter for immunization: Secondary | ICD-10-CM

## 2020-01-27 NOTE — Progress Notes (Signed)
   Covid-19 Vaccination Clinic  Name:  Brenda Roy    MRN: [id number] DOB: Mar 27, 1986  01/27/2020  Ms. Brenda Roy was observed post Covid-19 immunization for 30 minutes based on pre-vaccination screening without incidence. She was provided with Vaccine Information Sheet and instruction to access the V-Safe system.   Ms. Brenda Roy was instructed to call 911 with any severe reactions post vaccine: Marland Kitchen Difficulty breathing  . Swelling of your face and throat  . A fast heartbeat  . A bad rash all over your body  . Dizziness and weakness    Immunizations Administered    Name Date Dose VIS Date Route   Moderna COVID-19 Vaccine 01/27/2020  3:51 PM 0.5 mL 11/02/2019 Intramuscular   Manufacturer: Moderna   [id number]   [id number]

## 2020-02-07 ENCOUNTER — Ambulatory Visit: Payer: 59 | Attending: Internal Medicine

## 2020-02-07 DIAGNOSIS — Z20822 Contact with and (suspected) exposure to covid-19: Secondary | ICD-10-CM

## 2020-02-08 LAB — NOVEL CORONAVIRUS, NAA: SARS-CoV-2, NAA: NOT DETECTED

## 2020-02-14 ENCOUNTER — Other Ambulatory Visit: Payer: Self-pay | Admitting: Internal Medicine

## 2020-02-29 ENCOUNTER — Ambulatory Visit: Payer: 59 | Attending: Family

## 2020-02-29 DIAGNOSIS — Z23 Encounter for immunization: Secondary | ICD-10-CM

## 2020-02-29 NOTE — Progress Notes (Signed)
   Covid-19 Vaccination Clinic  Name:  Brenda Roy    MRN: [id number] DOB: 1986/06/10  02/29/2020  [name] was observed post Covid-19 immunization for 30 minutes based on pre-vaccination screening without incident. She was provided with Vaccine Information Sheet and instruction to access the V-Safe system.   [name] was instructed to call 911 with any severe reactions post vaccine: Marland Kitchen Difficulty breathing  . Swelling of face and throat  . A fast heartbeat  . A bad rash all over body  . Dizziness and weakness   Immunizations Administered    Name Date Dose VIS Date Route   Moderna COVID-19 Vaccine 02/29/2020  9:06 AM 0.5 mL 11/02/2019 Intramuscular   Manufacturer: Moderna   LotCA:[id number]   [place]:[id number]

## 2020-03-07 ENCOUNTER — Telehealth: Payer: Self-pay | Admitting: Internal Medicine

## 2020-03-07 NOTE — Telephone Encounter (Signed)
Called and spoke with patient. I let her know I was mailing her a new RX card for her medication (Amitiza). She had tried to go to web site, but it said it would only help if she had been laid off due to COVID-19.

## 2020-03-09 ENCOUNTER — Other Ambulatory Visit: Payer: Self-pay

## 2020-03-09 ENCOUNTER — Encounter: Payer: Self-pay | Admitting: Internal Medicine

## 2020-03-09 ENCOUNTER — Telehealth: Payer: Self-pay | Admitting: Internal Medicine

## 2020-03-09 ENCOUNTER — Ambulatory Visit: Payer: 59 | Admitting: Internal Medicine

## 2020-03-09 VITALS — BP 110/70 | HR 77 | Temp 98.9°F | Ht 65.5 in | Wt 172.0 lb

## 2020-03-09 DIAGNOSIS — T63301A Toxic effect of unspecified spider venom, accidental (unintentional), initial encounter: Secondary | ICD-10-CM

## 2020-03-09 MED ORDER — MUPIROCIN 2 % EX OINT: TOPICAL_OINTMENT | CUTANEOUS | 0 refills | Status: DC

## 2020-03-09 NOTE — Telephone Encounter (Signed)
Brenda Roy [phone]  [name] called to say she has a bump under her left breast that is reddish and has a ring around it.

## 2020-03-09 NOTE — Progress Notes (Signed)
   Subjective:    Patient ID: Brenda Roy, female    DOB: September 19, 1986, 34 y.o.   MRN: [id number]  HPI 34 year old Female in good health has recently noticed lesion on left lateral breast. Has been walking in [place] park recently but did not notice any insect sting or bite sensation. Lesion just seemed to appear and had 2 circular areas and a dark central area. Lesion has not changed after initial discovery.No fever, chills, headache, nausea or vomiting.  Tetanus immunization is up-to-date.    Review of Systems see above     Objective:   Physical Exam  BP 100/70; Temp 98.9 degrees, pulse 77. Weight 172 pounds.  Left lateral breast area: small circumscribed area apprx. 27mm in circumference with 2 dark rings surrounded by central small dark area. No drainage. No vesicles noted.  Lesion is essentially nontender.      Assessment & Plan:  I am suspicious that this is a brown recluse spider bite.  She is asymptomatic at the present time with no systemic symptoms.  I have given her a prescription for mupirocin to apply twice daily.  Keep area clean and dry.  Call with progress report in a few days.  If it is not clearing up in 1 to 2 weeks we can refer her to Dermatology.

## 2020-03-09 NOTE — Telephone Encounter (Signed)
Left message to confirm appt this evening at 3:45pm.

## 2020-03-09 NOTE — Telephone Encounter (Signed)
Needs OV.  

## 2020-03-09 NOTE — Telephone Encounter (Signed)
Patient called back and confirmed appt.

## 2020-03-13 ENCOUNTER — Telehealth: Payer: Self-pay

## 2020-03-13 NOTE — Telephone Encounter (Signed)
Patient called to give you an update on the spot on her breast she said it has a dark spot in the center and two dark rings around it, no new symptoms.

## 2020-03-13 NOTE — Telephone Encounter (Signed)
Spoke with patient. No signs of secondary infection. Using bactroban ointment. Continue to monitor and let me know if not resolving in 2 weeks. Still think this could be brown recluse spider bite.

## 2020-03-20 NOTE — Patient Instructions (Signed)
Apply mupirocin to lesions left breast twice daily.  Call with progress report in a few days.  Tetanus immunization is up-to-date.

## 2020-07-31 DIAGNOSIS — Z20828 Contact with and (suspected) exposure to other viral communicable diseases: Secondary | ICD-10-CM | POA: Diagnosis not present

## 2020-07-31 DIAGNOSIS — R05 Cough: Secondary | ICD-10-CM | POA: Diagnosis not present

## 2020-08-01 DIAGNOSIS — R05 Cough: Secondary | ICD-10-CM | POA: Diagnosis not present

## 2020-08-01 DIAGNOSIS — Z20828 Contact with and (suspected) exposure to other viral communicable diseases: Secondary | ICD-10-CM | POA: Diagnosis not present

## 2020-08-11 ENCOUNTER — Telehealth: Payer: Self-pay

## 2020-08-11 DIAGNOSIS — K5904 Chronic idiopathic constipation: Secondary | ICD-10-CM

## 2020-08-17 NOTE — Telephone Encounter (Signed)
Make GI referral and tell her we are sending her there to see if they can get it approved since it works well.

## 2020-08-17 NOTE — Telephone Encounter (Signed)
Patient called after AMITIZA being denied she talked to someone at [facility] and was told to try [facility] first so she would like that sent to her pharmacy.

## 2020-08-21 ENCOUNTER — Ambulatory Visit: Payer: Self-pay

## 2020-08-21 ENCOUNTER — Encounter: Payer: Self-pay | Admitting: Orthopedic Surgery

## 2020-08-21 ENCOUNTER — Ambulatory Visit: Payer: BC Managed Care – PPO | Admitting: Physician Assistant

## 2020-08-21 VITALS — Ht 65.0 in | Wt 190.0 lb

## 2020-08-21 DIAGNOSIS — M79671 Pain in right foot: Secondary | ICD-10-CM

## 2020-08-21 NOTE — Progress Notes (Signed)
Office Visit Note   Patient: Brenda Roy           Date of Birth: Jan 16, 1986           MRN: [id number] Visit Date: 08/21/2020              Requested by: Elby Showers, MD [address] [place],  [place] [address] PCP: Elby Showers, MD  Chief Complaint  Patient presents with  . Right Foot - Pain      HPI: Is a pleasant active 34 year old woman with a chief complaint of right heel pain.  She is a runner and has had plantar fasciitis in the past.  She has been rolling her foot on a water bottle.  She used to just have the symptoms with her first few steps in the morning but her symptoms have become more consistent.  Denies any other foot pain except that she has been running awkwardly because of the pain in her heel  Assessment & Plan: Visit Diagnoses:  1. Pain in right foot     Plan: Plantar fasciitis we discussed the natural history of this and some treatment option she could try.  We talked about fascial strengthening.  We also talked about weightbearing stretching of her Achilles.  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  Also recommended a well-padded Hoka shoe or other stiffer running shoe.  Also discussed the importance of crosstraining  Follow-Up Instructions: No follow-ups on file.   Ortho Exam  Patient is alert, oriented, no adenopathy, well-dressed, normal affect, normal respiratory effort. Focused examination of her right heel.  No swelling skin is in good condition overall well-maintained alignment.  Tender to palpation over the medial plantar insertion of the plantar fascia.  Overall fair Achilles mobility no other tenderness to any other part of her foot no swelling no discoloration  Imaging: No results found. No images are attached to the encounter.  Labs: Lab Results  Component Value Date   HGBA1C 5.4 06/22/2012   ESRSEDRATE 4 06/22/2012   LABORGA Multiple bacterial morphotypes present,  none 06/14/2014   LABORGA predominant. Suggest appropriate recollection if  06/14/2014   LABORGA clinically indicated. 06/14/2014     Lab Results  Component Value Date   ALBUMIN 4.0 05/23/2017   ALBUMIN 4.4 02/07/2014   ALBUMIN 4.3 02/02/2013    No results found for: MG Lab Results  Component Value Date   VD25OH 34 09/30/2019   VD25OH CANCELED 09/30/2019   VD25OH 25 (L) 05/23/2017    No results found for: PREALBUMIN CBC EXTENDED Latest Ref Rng & Units 09/30/2019 09/30/2019 05/23/2017  WBC 3.8 - 10.8 Thousand/uL 6.8 CANCELED 5.4  RBC 3.80 - 5.10 Million/uL 4.53 - 4.66  HGB 11.7 - 15.5 g/dL 11.9 - 12.7  HCT 35 - 45 % 37.3 - 39.4  PLT 140 - 400 Thousand/uL 279 - 246  NEUTROABS 1,500 - 7,800 cells/uL 3,930 - 2,916  LYMPHSABS 850 - 3,900 cells/uL 2,502 - 2,052     Body mass index is 31.62 kg/m.  Orders:  Orders Placed This Encounter  Procedures  . XR Foot 2 Views Right   No orders of the defined types were placed in this encounter.    Procedures: No procedures performed  Clinical Data: No additional findings.  ROS:  All other systems negative, except as noted in the HPI. Review of Systems  Objective: Vital Signs: Ht 5\' 5"  (1.651  m)   Wt 190 lb (86.2 kg)   BMI 31.62 kg/m   Specialty Comments:  No specialty comments available.  PMFS History: Patient Active Problem List   Diagnosis Date Noted  . Functional constipation 01/30/2019  . BMI 33.0-33.9,adult 05/30/2017  . Elevated LDL cholesterol level 05/30/2017  . History of urticaria 04/04/2013  . History of irritable bowel syndrome 08/26/2011  . Fibrocystic breast disease 08/26/2011   Past Medical History:  Diagnosis Date  . Fibroadenoma of breast     Family History  Problem Relation Age of Onset  . Hypertension Father     Past Surgical History:  Procedure Laterality Date  . BREAST LUMPECTOMY  12/2010  . MASTECTOMY, PARTIAL  12/2010   Social History   Occupational History  . Not on file    Tobacco Use  . Smoking status: Never Smoker  . Smokeless tobacco: Never Used  Substance and Sexual Activity  . Alcohol use: No  . Drug use: No  . Sexual activity: Not on file

## 2020-08-22 NOTE — Telephone Encounter (Signed)
Left voicemail.  Referral has been place to GI.

## 2020-09-05 ENCOUNTER — Encounter: Payer: Self-pay | Admitting: Gastroenterology

## 2020-10-02 ENCOUNTER — Ambulatory Visit: Payer: BC Managed Care – PPO | Attending: Internal Medicine

## 2020-10-02 DIAGNOSIS — Z23 Encounter for immunization: Secondary | ICD-10-CM

## 2020-10-02 NOTE — Progress Notes (Signed)
   Covid-19 Vaccination Clinic  Name:  Brenda Roy    MRN: [id number] DOB: 17-Aug-1986  10/02/2020  [name] was observed post Covid-19 immunization for 15 minutes without incident. She was provided with Vaccine Information Sheet and instruction to access the V-Safe system.   [name] was instructed to call 911 with any severe reactions post vaccine: Marland Kitchen Difficulty breathing  . Swelling of face and throat  . A fast heartbeat  . A bad rash all over body  . Dizziness and weakness

## 2020-10-31 ENCOUNTER — Encounter: Payer: Self-pay | Admitting: Gastroenterology

## 2020-10-31 ENCOUNTER — Ambulatory Visit: Payer: BC Managed Care – PPO | Admitting: Gastroenterology

## 2020-10-31 VITALS — BP 112/72 | HR 95 | Ht 65.0 in | Wt 199.5 lb

## 2020-10-31 DIAGNOSIS — R140 Abdominal distension (gaseous): Secondary | ICD-10-CM

## 2020-10-31 DIAGNOSIS — K581 Irritable bowel syndrome with constipation: Secondary | ICD-10-CM

## 2020-10-31 NOTE — Progress Notes (Signed)
[facility] Consult Note:  History: Brenda Roy 10/31/2020  Referring provider: Elby Showers, MD  Reason for consult/chief complaint: Irritable Bowel Syndrome (have constipation. hasnt taken amitizia since April ) and Medication Management (Amitiza. said that she would like to talk about cost since it is costly. 360 a month )   Subjective  HPI:  This is a very pleasant 34 year old woman referred by primary care for IBS-C.  Records from [facility] in [place] from 2011 indicated visit for suspected IBS-C, normal CT abdomen and pelvis, no colonoscopy report.  Patient was improved on Amitiza.  Brenda Roy remained on Amitiza 8 mcg twice daily for most of the last decade except for brief periods of time when she is moving in between jobs.  About 6 months ago her insurance would no longer cover Amitiza and the benefit card she had from the manufacturer expired.  The medicine was costing $360 a month, which she only did for 1 month and could no longer afford.  Dr. Renold Genta had been prescribing the medicine for years since it worked well.  She has chronic abdominal bloating and constipation, with usually a few days between BMs.  Intermittent painless rectal bleeding when straining for bowel movements.  If no BM for few days, would then have some loose stool and then back to constipation.  If she takes Amitiza at 8 mcg twice daily, bowel movement every morning with much less bloating and quality of life definitely improved. She denies chronic heartburn, dysphagia, odynophagia vomiting or weight change. ROS:  Review of Systems  Constitutional: Negative for appetite change and unexpected weight change.  HENT: Negative for mouth sores and voice change.   Eyes: Negative for pain and redness.  Respiratory: Negative for cough and shortness of breath.   Cardiovascular: Negative for chest pain and palpitations.  Genitourinary: Negative for  dysuria and hematuria.  Musculoskeletal: Negative for arthralgias and myalgias.  Skin: Negative for pallor and rash.  Neurological: Negative for weakness and headaches.  Hematological: Negative for adenopathy.     Past Medical History: Past Medical History:  Diagnosis Date  . Fibroadenoma of breast   . IBS (irritable bowel syndrome)   . UTI (urinary tract infection)      Past Surgical History: Past Surgical History:  Procedure Laterality Date  . BREAST LUMPECTOMY  12/2010  . MASTECTOMY, PARTIAL  12/2010  . NASAL ENDOSCOPY       Family History: Family History  Problem Relation Age of Onset  . Other Mother        GERD  . Hypertension Father   . Gastric cancer Maternal Grandmother   . Other Paternal Grandmother        Not clear but probably stomach problems   . Breast cancer Paternal Aunt   . Esophageal cancer Maternal Uncle   . Esophageal cancer Cousin        dad's niece   . Colon cancer Neg Hx     Social History: Social History   Socioeconomic History  . Marital status: Single    Spouse name: Not on file  . Number of children: Not on file  . Years of education: Not on file  . Highest education level: Not on file  Occupational History  . Not on file  Tobacco Use  . Smoking status: Never Smoker  . Smokeless tobacco: Never Used  Vaping Use  . Vaping Use: Never used  Substance and Sexual Activity  . Alcohol use: No  .  Drug use: No  . Sexual activity: Not on file  Other Topics Concern  . Not on file  Social History Narrative  . Not on file   Social Determinants of Health   Financial Resource Strain:   . Difficulty of Paying Living Expenses: Not on file  Food Insecurity:   . Worried About Charity fundraiser in the Last Year: Not on file  . Ran Out of Food in the Last Year: Not on file  Transportation Needs:   . Lack of Transportation (Medical): Not on file  . Lack of Transportation (Non-Medical): Not on file  Physical Activity:   . Days of  Exercise per Week: Not on file  . Minutes of Exercise per Session: Not on file  Stress:   . Feeling of Stress : Not on file  Social Connections:   . Frequency of Communication with Friends and Family: Not on file  . Frequency of Social Gatherings with Friends and Family: Not on file  . Attends Religious Services: Not on file  . Active Member of Clubs or Organizations: Not on file  . Attends Archivist Meetings: Not on file  . 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  Allergies: Allergies  Allergen Reactions  . Minocycline Hives and Rash  . Sulfa Antibiotics Rash    Outpatient Meds: Current Outpatient Medications  Medication Sig Dispense Refill  . cetirizine (ZYRTEC) 10 MG tablet Take 10 mg by mouth daily.    . cholecalciferol (VITAMIN D3) 25 MCG (1000 UT) tablet Take 2,000 Units by mouth daily.    Brenda Roy 91-Day (CAMRESE LO PO) Take by mouth daily.    . AMITIZA 8 MCG capsule TAKE 1 CAPSULE (8 MCG TOTAL) BY MOUTH 2 (TWO) TIMES DAILY WITH A MEAL. (Patient not taking: Reported on 10/31/2020) 180 capsule 1  . azelastine (OPTIVAR) 0.05 % ophthalmic solution Apply 1 drop to eye in the morning and at bedtime. (Patient not taking: Reported on 10/31/2020)     No current facility-administered medications for this visit.   Facility-Administered Medications Ordered in Other Visits  Medication Dose Route Frequency Provider Last Rate Last Admin  . hpv vaccine (GARDASIL) injection 0.5 mL  0.5 mL Intramuscular Once Elby Showers, MD          ___________________________________________________________________ Objective   Exam:  BP 112/72   Pulse 95   Ht 5\' 5"  (1.651 m)   Wt 199 lb 8 oz (90.5 kg)   BMI 33.20 kg/m    General: Well-appearing  Eyes: sclera anicteric, no redness  ENT: oral mucosa moist without lesions, no cervical or supraclavicular  lymphadenopathy  CV: RRR without murmur, S1/S2, no JVD, no peripheral edema  Resp: clear to auscultation bilaterally, normal RR and effort noted  GI: soft, mild scattered tenderness, with active bowel sounds. No guarding or palpable organomegaly noted.  Skin; warm and dry, no rash or jaundice noted  Neuro: awake, alert and oriented x 3. Normal gross motor function and fluent speech Perianal exam and DRE: normal  Labs:  CBC Latest Ref Rng & Units 09/30/2019 09/30/2019 05/23/2017  WBC 3.8 - 10.8 Thousand/uL 6.8 CANCELED 5.4  Hemoglobin 11.7 - 15.5 g/dL 11.9 - 12.7  Hematocrit 35 - 45 % 37.3 - 39.4  Platelets 140 - 400 Thousand/uL 279 - 246   CMP Latest Ref Rng & Units 09/30/2019 09/30/2019 05/23/2017  Glucose 65 - 99  mg/dL 90 CANCELED 80  BUN 7 - 25 mg/dL 16 - 11  Creatinine 0.50 - 1.10 mg/dL 0.90 - 0.88  Sodium 135 - 146 mmol/L 140 - 135  Potassium 3.5 - 5.3 mmol/L 4.8 - 4.2  Chloride 98 - 110 mmol/L 107 - 103  CO2 20 - 32 mmol/L 23 - 20  Calcium 8.6 - 10.2 mg/dL 9.6 - 9.3  Total Protein 6.1 - 8.1 g/dL 6.8 - 7.0  Total Bilirubin 0.2 - 1.2 mg/dL 0.6 - 0.5  Alkaline Phos 33 - 115 U/L - - 56  AST 10 - 30 U/L 17 - 15  ALT 6 - 29 U/L 16 - 15   Nml TSH and Free T4 Oct 2020   Assessment: Encounter Diagnoses  Name Primary?  . Irritable bowel syndrome with constipation Yes  . Abdominal bloating     Over a decade of IBS-C, was doing well many years on Amitiza until the medicine became prohibitively expensive due to insurance restriction earlier this year.  Has taken MiraLAX from time to time without much improvement. Eller find out from her insurance that she would need to try and fail Trulance before Amitiza might be covered (though unknown what the Amitiza cost would be even if approved with prior authorization)  Amitiza and Trulance work on different receptors but and result same with increased chloride and fluid secretion to the small bowel, so effect/side effect profile  likely to be similar to Amitiza.  Plan:  MiraLAX for 2 days, then start samples of Trulance 3 mg once daily in the morning. At least a week of samples given.  Then I asked her to contact us by nursing call or portal message with an update.  If working fairly well, will prescribe that.  If not, will try to get PA for Amitiza.  Thank you for the courtesy of this consult.  Please call me with any questions or concerns.  Nelida Meuse III  CC: Referring provider noted above

## 2020-10-31 NOTE — Patient Instructions (Signed)
If you are age 34 or older, your body mass index should be between 23-30. Your Body mass index is 33.2 kg/m. If this is out of the aforementioned range listed, please consider follow up with your Primary Care Provider.  If you are age 57 or younger, your body mass index should be between 19-25. Your Body mass index is 33.2 kg/m. If this is out of the aformentioned range listed, please consider follow up with your Primary Care Provider.   START Trulance samples. 1 3 mg tablet daily.  Contact the office to let us know how they are working for you.  Follow up as needed.  Thank you for entrusting me with your care and choosing [facility].  Dr. Loletha Carrow

## 2020-11-14 NOTE — Telephone Encounter (Signed)
Vivien Rota, please see below. Thank you.

## 2020-11-14 NOTE — Telephone Encounter (Signed)
Please see my recent office note and today's MyChart message.  Patient needs prior authorization done for Amitiza 8 mcg twice daily.  The patient believes that she required a trial of Trulance, which was done with office samples.  Trulance did not work well and also had intolerable side effects.

## 2020-11-17 ENCOUNTER — Telehealth: Payer: Self-pay

## 2020-11-17 NOTE — Telephone Encounter (Signed)
PA for Amitiza has been sent with covermymeds

## 2020-11-27 ENCOUNTER — Other Ambulatory Visit: Payer: BC Managed Care – PPO

## 2020-11-27 DIAGNOSIS — Z20822 Contact with and (suspected) exposure to covid-19: Secondary | ICD-10-CM | POA: Diagnosis not present

## 2020-11-28 LAB — SARS-COV-2, NAA 2 DAY TAT

## 2020-11-28 LAB — NOVEL CORONAVIRUS, NAA: SARS-CoV-2, NAA: NOT DETECTED

## 2020-11-29 NOTE — Telephone Encounter (Signed)
Brenda Roy (Key: [id number])  Amitiza capsules     Status: PA Response - Approved  Created: December 17th, 2021  Sent: December 17th, 2021

## 2020-12-05 ENCOUNTER — Other Ambulatory Visit: Payer: BC Managed Care – PPO

## 2020-12-09 ENCOUNTER — Other Ambulatory Visit: Payer: BC Managed Care – PPO

## 2020-12-09 DIAGNOSIS — Z20822 Contact with and (suspected) exposure to covid-19: Secondary | ICD-10-CM

## 2020-12-10 DIAGNOSIS — J029 Acute pharyngitis, unspecified: Secondary | ICD-10-CM | POA: Diagnosis not present

## 2020-12-10 DIAGNOSIS — J039 Acute tonsillitis, unspecified: Secondary | ICD-10-CM | POA: Diagnosis not present

## 2020-12-12 LAB — NOVEL CORONAVIRUS, NAA: SARS-CoV-2, NAA: DETECTED — AB

## 2020-12-13 ENCOUNTER — Telehealth (INDEPENDENT_AMBULATORY_CARE_PROVIDER_SITE_OTHER): Payer: BC Managed Care – PPO | Admitting: Internal Medicine

## 2020-12-13 ENCOUNTER — Encounter: Payer: Self-pay | Admitting: Internal Medicine

## 2020-12-13 ENCOUNTER — Telehealth: Payer: Self-pay | Admitting: Internal Medicine

## 2020-12-13 DIAGNOSIS — U071 COVID-19: Secondary | ICD-10-CM | POA: Diagnosis not present

## 2020-12-13 NOTE — Telephone Encounter (Signed)
Patient was contacted by phone regarding positive test for Covid-19 and request for advice.  Patient is identified as Barrister's clerk E. 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  She is at her home and I am at my office.  She is agreeable to visit in this format today.  Patient says she tested positive for COVID-19 on Saturday.  On New Year's weekend January 1 and 2 she went to the beach with friends.  She felt fine until Saturday afternoon January 8 after awakening from a nap she noted that her throat hurt and she was concerned she might have strep throat.  She was tested for strep and it was negative.  She was placed on Amoxicillin.  COVID-19 test proved to be positive.  Patient is quarantining at home and is able to work from home.  She is feeling much better.  She has no fever or chills.  Throat is improving.  Her boss at work is told her she can stay out of work until January 17 and continue to work from home which she is agreeable to doing.  She has no significant cough.  No nausea or vomiting.  Able to stay hydrated.  Patient was advised to complete course of amoxicillin for at least 7 days if not 10 days.  She is advised to stay well-hydrated.  May take over-the-counter analgesics if needed.  Currently not febrile.  Throat is not painful at the present time.  Patient advised to quarantine at home for the remainder of the week- at least  through  Friday, January 14.  She indicates she will do this.  She knows she may contact me with any further questions.  She has had 3 COVID-19 immunizations.  MJB, MD  Time spent on phone is 10 minutes

## 2020-12-13 NOTE — Telephone Encounter (Signed)
Patient was called .

## 2020-12-13 NOTE — Telephone Encounter (Signed)
Brenda Roy [phone]  Brenda Roy called to say she has tested positive for COVID-19, she went to the [facility] on Saturday and her results came back last night, she had a bad sore throat, some congestion. They gave her amoxicillin, she wants to know if she should continue that.

## 2020-12-19 ENCOUNTER — Telehealth: Payer: Self-pay | Admitting: Internal Medicine

## 2020-12-19 NOTE — Telephone Encounter (Signed)
Brenda Roy [phone]  [name] called to say she is post 10 day from finding out that she had COVID-19 and she is wandering if it would be alright for her to return back to the gym to start working out. She had only a sore throat no cough.

## 2020-12-19 NOTE — Telephone Encounter (Signed)
Called and let patient know what Dr Baxley said, she verbalized understanding 

## 2020-12-19 NOTE — Telephone Encounter (Signed)
I would not push and do heavy exercise for the next week- just some light exercise.

## 2021-02-01 DIAGNOSIS — M9902 Segmental and somatic dysfunction of thoracic region: Secondary | ICD-10-CM | POA: Diagnosis not present

## 2021-02-01 DIAGNOSIS — M9901 Segmental and somatic dysfunction of cervical region: Secondary | ICD-10-CM | POA: Diagnosis not present

## 2021-02-01 DIAGNOSIS — M542 Cervicalgia: Secondary | ICD-10-CM | POA: Diagnosis not present

## 2021-02-01 DIAGNOSIS — M62838 Other muscle spasm: Secondary | ICD-10-CM | POA: Diagnosis not present

## 2021-02-06 DIAGNOSIS — M9901 Segmental and somatic dysfunction of cervical region: Secondary | ICD-10-CM | POA: Diagnosis not present

## 2021-02-06 DIAGNOSIS — M62838 Other muscle spasm: Secondary | ICD-10-CM | POA: Diagnosis not present

## 2021-02-06 DIAGNOSIS — M542 Cervicalgia: Secondary | ICD-10-CM | POA: Diagnosis not present

## 2021-02-06 DIAGNOSIS — M9902 Segmental and somatic dysfunction of thoracic region: Secondary | ICD-10-CM | POA: Diagnosis not present

## 2021-04-12 ENCOUNTER — Ambulatory Visit: Payer: BC Managed Care – PPO | Attending: Internal Medicine

## 2021-04-12 DIAGNOSIS — Z20822 Contact with and (suspected) exposure to covid-19: Secondary | ICD-10-CM

## 2021-04-14 LAB — SARS-COV-2, NAA 2 DAY TAT

## 2021-04-14 LAB — NOVEL CORONAVIRUS, NAA: SARS-CoV-2, NAA: NOT DETECTED

## 2021-05-01 ENCOUNTER — Encounter: Payer: Self-pay | Admitting: Internal Medicine

## 2021-05-01 ENCOUNTER — Telehealth: Payer: Self-pay | Admitting: Internal Medicine

## 2021-05-01 ENCOUNTER — Ambulatory Visit: Payer: BC Managed Care – PPO | Admitting: Internal Medicine

## 2021-05-01 ENCOUNTER — Other Ambulatory Visit: Payer: Self-pay

## 2021-05-01 VITALS — BP 110/80 | HR 78 | Temp 98.9°F | Ht 65.0 in | Wt 200.0 lb

## 2021-05-01 DIAGNOSIS — S30861A Insect bite (nonvenomous) of abdominal wall, initial encounter: Secondary | ICD-10-CM | POA: Diagnosis not present

## 2021-05-01 DIAGNOSIS — W57XXXA Bitten or stung by nonvenomous insect and other nonvenomous arthropods, initial encounter: Secondary | ICD-10-CM

## 2021-05-01 MED ORDER — TRIAMCINOLONE ACETONIDE 0.1 % EX CREA: 1.0000 "application " | TOPICAL_CREAM | Freq: Three times a day (TID) | CUTANEOUS | 1 refills | Status: DC

## 2021-05-01 NOTE — Patient Instructions (Signed)
Do not apply any topical adhesives to the bite area.  Use triamcinolone cream 0.1% up to 3 times daily.  May clean this with warm soapy water and then pat dry.  Tetanus immunization may need to be updated.  Please check your records.

## 2021-05-01 NOTE — Telephone Encounter (Signed)
Brenda Roy [phone]  Brenda Roy called to say she was bitten on the back yesterday by a tick, she got the tick off and has saved it, but the spot is sore and a rash around it, however it could be an allergic reaction to the banaid that was put it with neosporin. She stated she is allergic to a lot of band aids.

## 2021-05-01 NOTE — Telephone Encounter (Signed)
Scheduled

## 2021-05-01 NOTE — Telephone Encounter (Signed)
We can see her today 10 min visit indoors

## 2021-05-01 NOTE — Progress Notes (Signed)
   Subjective:    Patient ID: Brenda Roy, female    DOB: 12/29/1985, 35 y.o.   MRN: [id number]  HPI 35 year old Female found a small tick on her right posterior trunk area recently.  She cleaned it well, and someone removed the tick, and applied a Band-Aid.  She also used Neosporin before applying the Band-Aid.  Says she has a history of intolerance to adhesives.  Today has noticed that that area is sore and irritated.    Review of Systems no headache fever chills or rash     Objective:   Physical Exam Blood pressure 110/80 pulse 78 temperature 98.9 degrees pulse oximetry 97% weight 200 pounds BMI 33.28  Right mid posterior trunk examined.  There is an area of erythema where the Band-Aid has been attached and in the center of that we will help wide shape of a Band-Aid is a clean insect bite wound without drainage.     Assessment & Plan:  Probable allergy or local reaction to Band-Aids/adhesive  Tick bite with no symptoms of tickborne illness at present time  Plan: Since this appears to be a contact dermatitis to adhesive, will prescribe triamcinolone cream 0.1% to apply 3 times daily until healed.  The Band-Aid was removed today and she should not use any coverings on this bite at all.  I expect the lesion will heal within a few days.

## 2021-05-08 NOTE — Telephone Encounter (Signed)
Brenda Roy called to say that now her tick bit has a circle that is forming around it.

## 2021-05-08 NOTE — Telephone Encounter (Signed)
Circle is red and the size of a nickel, no fever no chills, area doesn't appear swollen. She said it is not bothering her but she feels very tired, she still applying the cream.

## 2021-05-10 ENCOUNTER — Other Ambulatory Visit: Payer: Self-pay

## 2021-05-10 ENCOUNTER — Other Ambulatory Visit: Payer: BC Managed Care – PPO | Admitting: Internal Medicine

## 2021-05-10 DIAGNOSIS — W57XXXA Bitten or stung by nonvenomous insect and other nonvenomous arthropods, initial encounter: Secondary | ICD-10-CM | POA: Diagnosis not present

## 2021-05-10 DIAGNOSIS — S30861A Insect bite (nonvenomous) of abdominal wall, initial encounter: Secondary | ICD-10-CM | POA: Diagnosis not present

## 2021-05-10 MED ORDER — DOXYCYCLINE HYCLATE 100 MG PO TABS: 100.0000 mg | ORAL_TABLET | Freq: Two times a day (BID) | ORAL | 0 refills | Status: DC

## 2021-05-10 NOTE — Telephone Encounter (Signed)
Schedule lab at 11:00 today.

## 2021-05-10 NOTE — Telephone Encounter (Signed)
Patient was notified of Dr. Lauree Chandler recommendations. Doxy was sent to her [facility].

## 2021-05-10 NOTE — Addendum Note (Signed)
Addended by: Mady Haagensen on: 05/10/2021 03:22 PM   Modules accepted: Orders

## 2021-05-11 LAB — ROCKY MTN SPOTTED FVR ABS PNL(IGG+IGM)
RMSF IgG: NOT DETECTED
RMSF IgM: NOT DETECTED

## 2021-05-11 LAB — B. BURGDORFI ANTIBODIES: B burgdorferi Ab IgG+IgM: <0.9 index

## 2021-07-02 DIAGNOSIS — Z01419 Encounter for gynecological examination (general) (routine) without abnormal findings: Secondary | ICD-10-CM | POA: Diagnosis not present

## 2021-07-02 DIAGNOSIS — Z6835 Body mass index (BMI) 35.0-35.9, adult: Secondary | ICD-10-CM | POA: Diagnosis not present

## 2022-01-28 ENCOUNTER — Telehealth: Payer: Self-pay | Admitting: Internal Medicine

## 2022-01-28 NOTE — Telephone Encounter (Signed)
Noemy Tauzin [phone]  Rainna called to say her father past away suddenly a couple of weeks ago and she was wanting a referral for counseling. I gave her names and numbers to the 3 places that you refer out to.

## 2022-02-01 ENCOUNTER — Ambulatory Visit (INDEPENDENT_AMBULATORY_CARE_PROVIDER_SITE_OTHER): Payer: BC Managed Care – PPO | Admitting: Professional

## 2022-02-01 ENCOUNTER — Encounter: Payer: Self-pay | Admitting: Professional

## 2022-02-01 DIAGNOSIS — F330 Major depressive disorder, recurrent, mild: Secondary | ICD-10-CM

## 2022-02-01 NOTE — Progress Notes (Addendum)
[facility] Behavioral Health Counselor Initial Adult Exam  Name: Brenda Roy Date: 02/01/2022 MRN: [id number] DOB: 02-22-1986 PCP: Margaree Mackintosh, MD  Time spent: 53 minutes 901-954am  Guardian/Payee:  self    Paperwork requested: No   Reason for Visit Loman Chroman Problem: This session was held via video teletherapy due to the coronavirus risk at this time. The patient consented to video teletherapy and was located at her home during this session. She is aware it is the responsibility of the patient to secure confidentiality on her end of the session. The provider was in a private home office for the duration of this session.   The patient arrived on time for her webex appointment appearing nicely groomed and easily engaged  One month ago her father died suddenly. She thinks she is handling okay but sh is going on with life but still gets sad a lot and is not sleeping which is worrying her. Patient is having trouble falling asleep and is very restless. Patient is awakening tired with her typical schedule being 8-9 hours per night.  This week she is currently getting around five hours of sleep per night. Work is busy on a normal year but balancing how to take care of her mom.  Mental Status Exam: Appearance:   Neat     Behavior:  Appropriate and Sharing  Motor:  Normal  Speech/Language:   Clear and Coherent and Normal Rate  Affect:  Full Range  Mood:  sad  Thought process:  goal directed  Thought content:    WNL  Sensory/Perceptual disturbances:    WNL  Orientation:  oriented to person, place, time/date, and situation  Attention:  Good  Concentration:  Good  Memory:  WNL  Fund of knowledge:   Good  Insight:    Good  Judgment:   Good  Impulse Control:  Good   Reported Symptoms: sleeplessness, sad, decreased energy  Risk Assessment: Danger to Self:  No Self-injurious Behavior: No Danger to Others: No Duty to Warn:no Physical Aggression / Violence:No  Access to Firearms a  concern: No  Gang Involvement:No  Patient / guardian was educated about steps to take if suicide or homicide risk level increases between visits: n/a While future psychiatric events cannot be accurately predicted, the patient does not currently require acute inpatient psychiatric care and does not currently meet [facility] involuntary commitment criteria.  Substance Abuse History: Current substance abuse: No     Past Psychiatric History:   Previous psychological history is significant for depression Outpatient Providers:in college History of Psych Hospitalization: No  Psychological Testing:  none    Abuse History:  Victim of: No.   Report needed: No. Victim of Neglect:No. Perpetrator of  none   Witness / Exposure to Domestic Violence: No   Protective Services Involvement: No  Witness to [employer] Violence:  No   Family History:  Family History  Problem Relation Age of Onset   Other Mother        GERD   Hypertension Father    Gastric cancer Maternal Grandmother    Other Paternal Grandmother        Not clear but probably stomach problems    Breast cancer Paternal Aunt    Esophageal cancer Maternal Uncle    Esophageal cancer Cousin        dad's niece    Colon cancer Neg Hx     Living situation: the patient lives alone  Sexual Orientation: Straight  Relationship Status: single  Name of  spouse / other: never married If a parent, number of children / ages: none Patient is the youngest child with an older brother. Her older brother is married and lives in [place]. They do not have the best relationship during normal times. Her brother is 6.5 years older and they wee friendly as kids but too different in age to hang out. When she graduated graduate school she realized that she did not like her brother or his wife. She has constant contact with her nephews 10 and 6.  Her brother and his family came and stayed at her house and it was very overwhelming given that she is an  introvert.  Her parents relocated before Covid from [place] where the pt was a child. Her father wanted to retire, he sold the business and retired to [place]. When the pt took her current position, her mother a ready to retire and no longer wanted to work FT. They moved to [place].  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 She had been a Event organiser for 6-7 years and was burned out and went back to school for her MBA and the job in the [employer] world opened.  Military Service: No   Educational History: Education: Licensed conveyancer: Jewish  Any cultural differences that may affect / interfere with treatment:  observation of holy days and specific religious practices  Recreation/Hobbies: working out  Stressors: Educational concerns   Loss of father one month ago   Occupational concerns    Strengths: Supportive Relationships, Church, Spirituality, and Able to Communicate Effectively  Barriers:  limited familial   Legal History: Pending legal issue / charges: The patient has no significant history of legal issues. History of legal issue / charges:  none  Medical History/Surgical History: reviewed Past Medical History:  Diagnosis Date   Fibroadenoma of breast    IBS (irritable bowel syndrome)    UTI (urinary tract infection)     Past Surgical History:  Procedure Laterality Date   BREAST LUMPECTOMY  12/2010   MASTECTOMY, PARTIAL  12/2010   NASAL ENDOSCOPY      Medications: Current Outpatient Medications  Medication Sig Dispense Refill   cetirizine (ZYRTEC) 10 MG tablet Take 10 mg by mouth daily.      cholecalciferol (VITAMIN D3) 25 MCG (1000 UT) tablet Take 2,000 Units by mouth daily.     doxycycline (VIBRA-TABS) 100 MG tablet Take 1 tablet (100 mg total) by mouth 2 (two) times daily. 14 tablet 0   Levonorgest-Eth Estrad 91-Day (CAMRESE LO PO) Take by mouth daily.     triamcinolone cream (KENALOG) 0.1 % Apply 1 application topically 3 (three) times daily. 30 g 1   No current facility-administered medications for this visit.   Facility-Administered Medications Ordered in Other Visits  Medication Dose Route Frequency Provider Last Rate Last Admin   hpv vaccine (GARDASIL) injection 0.5 mL  0.5 mL Intramuscular Once Margaree Mackintosh, MD        Allergies  Allergen Reactions   Minocycline Hives and Rash   Sulfa Antibiotics Rash    Diagnoses:  Major depressive disorder, recurrent episode, mild (HCC)  Plan of Care:  -meet on  Friday, February 08, 2022 at [facility], [facility]

## 2022-02-01 NOTE — Progress Notes (Signed)
° ° ° ° ° ° ° ° ° ° ° ° ° ° °  Maghan Jessee, LCMHC °

## 2022-02-08 ENCOUNTER — Encounter: Payer: Self-pay | Admitting: Professional

## 2022-02-08 ENCOUNTER — Ambulatory Visit: Payer: BC Managed Care – PPO | Admitting: Professional

## 2022-02-08 DIAGNOSIS — F4322 Adjustment disorder with anxiety: Secondary | ICD-10-CM | POA: Insufficient documentation

## 2022-02-08 NOTE — Progress Notes (Addendum)
[facility] Counselor/Therapist Progress Note  Patient ID: Brenda Roy, MRN: [id number],    Date: 02/08/2022  Time Spent: 47 minutes 9-947am  Treatment Type: Individual Therapy  Reported Symptoms: impaired sleep  Mental Status Exam: Appearance:  Well Groomed     Behavior: Appropriate and Sharing  Motor: Normal  Speech/Language:  Clear and Coherent and Normal Rate  Affect: Full Range  Mood: normal  Thought process: goal directed  Thought content:   WNL  Sensory/Perceptual disturbances:   WNL  Orientation: oriented to person, place, time/date, and situation  Attention: Good  Concentration: Good  Memory: WNL  Fund of knowledge:  Good  Insight:   Good  Judgment:  Good  Impulse Control: Good   Risk Assessment: Danger to Self:  No Self-injurious Behavior: No Danger to Others: No Duty to Warn:no Physical Aggression / Violence:No  Access to Firearms a concern: No  Gang Involvement:No   Subjective: This session was held via video teletherapy due to the coronavirus risk at this time. The patient consented to video teletherapy and was located in her home office during this session. She is aware it is the responsibility of the patient to secure confidentiality on her end of the session. The provider was in a private home office for the duration of this session.   The patient arrived on time for her webex appointment appearing nicely groomed and easily engaged.  Issues addressed: 1- treatment planning -completed treatment planning with patient -patient fully participated and agreed to treatment plan. 2-sleep issues -sleep hygiene  Treatment Plan Problems Addressed  Anxiety, Grief / Loss Unresolved, Grief / Loss Unresolved, Sleep Disturbance  Goals 1. Begin a healthy grieving process around the loss. 2. Complete the process of letting go of the lost significant other. 3. End abrupt awakening in terror and return to peaceful, restful sleep pattern. 4.  Enhance ability to effectively cope with the full variety of life's worries and anxieties. 5. Feel refreshed and energetic during wakeful hours. 6. Learn and implement coping skills that result in a reduction of anxiety and worry, and improved daily functioning. Objective Learn and implement calming skills to reduce overall anxiety and manage anxiety symptoms. Target Date: 2023-02-08 Frequency: Biweekly  Progress: 0 Modality: individual  Related Interventions Teach the client calming/relaxation skills (e.g., applied relaxation, progressive muscle relaxation, cue controlled relaxation; mindful breathing; biofeedback) and how to discriminate better between relaxation and tension; teach the client how to apply these skills to his/her daily life (e.g., New Directions in Progressive Muscle Relaxation by Casper Harrison, and Hazlett-Stevens; Treating Generalized Anxiety Disorder by Rygh and Amparo Bristol). Assign the client homework each session in which he/she practices relaxation exercises daily, gradually applying them progressively from non-anxiety-provoking to anxiety-provoking situations; review and reinforce success while providing corrective feedback toward improvement. Objective Verbalize an understanding of the role that cognitive biases play in excessive irrational worry and persistent anxiety symptoms. Target Date: 2023-02-08 Frequency: Biweekly  Progress: 0 Modality: individual  Related Interventions Assist the client in analyzing his/her worries by examining potential biases such as the probability of the negative expectation occurring, the real consequences of it occurring, his/her ability to control the outcome, the worst possible outcome, and his/her ability to accept it (see "Analyze the Probability of a Feared Event" in the Adult Psychotherapy Homework Planner by Bryn Gulling; Cognitive Therapy of Anxiety Disorders by Alison Stalling). Help the client gain insight into the notion that worry  may function as a form of avoidance of a feared problem and that  it creates acute and chronic tension. Objective Reestablish a consistent sleep-wake cycle. Target Date: 2023-02-08 Frequency: Biweekly  Progress: 0 Modality: individual  Related Interventions Teach and implement sleep hygiene practices to help the client reestablish a consistent sleep-wake cycle; review, reinforce success, and provide corrective feedback toward improvement. Objective Learn and implement problem-solving strategies for realistically addressing worries. Target Date: 2023-02-08 Frequency: Biweekly  Progress: 0 Modality: individual  Related Interventions Teach the client problem-solving strategies involving specifically defining a problem, generating options for addressing it, evaluating the pros and cons of each option, selecting and implementing an optional action, and reevaluating and refining the action (or assign "Applying Problem-Solving to Interpersonal Conflict" in the Adult Psychotherapy Homework Planner by Bryn Gulling). Objective Learn and implement personal and interpersonal skills to reduce anxiety and improve interpersonal relationships. Target Date: 2023-02-08 Frequency: Biweekly  Progress: 0 Modality: individual  Related Interventions Use instruction, modeling, and role-playing to build the client's general social, communication, and/or conflict resolution skills. Assign the client a homework exercise in which he/she implements communication skills training into his/her daily life (or assign "Restoring Socialization Comfort" in the Adult Psychotherapy Homework Planner by [facility]); review, reinforce success, and provide corrective feedback toward improvement. Objective Acknowledge dependency on lost loved one and begin to refocus life on independent actions to meet emotional needs. Target Date: 2023-02-08 Frequency: Biweekly  Progress: 0 Modality: individual  Related Interventions Assist the client in  identifying how he/she depended upon the significant other, expressing and resolving the accompanying feelings of abandonment and of being left alone. Explore the feelings of anger or guilt that surround the loss, helping the client understand the sources for such feelings. Objective Verbalize resolution of feelings of guilt and regret associated with the loss. Target Date: 2023-02-08 Frequency: Biweekly  Progress: 0 Modality: individual  Related Interventions Assign the client to make a list of all the regrets associated with actions toward or relationship with the deceased; process the list content toward resolution of these feelings. Objective Reengage in activities with family, friends, coworkers, and others. Target Date: 2023-02-08 Frequency: Biweekly  Progress: 0 Modality: individual  Related Interventions Assist the client in recommitting and reengaging in the primary social positive roles in which he/she has functioned prior to the loss. Promote behavioral activation by assisting the client in listing activities which he/she previously enjoyed but has not engaged in since experiencing the loss and then encourage reengagement in these activities (or assign "Identify and Schedule Pleasant Activities" in the Adult Psychotherapy Homework Planner by Bryn Gulling). Objective Begin verbalizing feelings associated with the loss. Target Date: 2023-02-08 Frequency: Biweekly  Progress: 0 Modality: individual  Related Interventions Ask the client to bring pictures or mementos connected with his/her loss to a session and talk about them (or assign "Creating a [employer]" in the Adult Psychotherapy Homework Planner by Bryn Gulling). Assist the client in identifying and expressing feelings connected with his/her loss. 7. Reduce overall frequency, intensity, and duration of the anxiety so that daily functioning is not impaired. 8. Resolve the core conflict that is the source of anxiety. 9. Restore  restful sleep pattern. Objective Learn and implement skills for managing stresses contributing to the sleep problem. Target Date: 2023-02-08 Frequency: Biweekly  Progress: 0 Modality: individual  Related Interventions Use cognitive behavioral skills training techniques (e.g., instruction, covert modeling [i.e., imagining the successful use of the strategies], role-play, practice, and generalization training) to teach the client tailored skills (e.g., calming and coping skills, conflict-resolution, problem-solving) for managing stressors related to the sleep disturbance (e.g., interpersonal  conflicts that carry over and cause nighttime wakefulness); routinely review, reinforce successes, problem-solve obstacles toward effective everyday use (see Insomnia: A Clinical Guide to Assessment and Treatment by Carolynn Serve and Espie; Treating Sleep Disorders by Madilyn Fireman and Lichstein). Objective Verbalize an understanding of the cognitive-behavioral approach to treating sleeplessness. Target Date: 2023-02-08 Frequency: Biweekly  Progress: 0 Modality: individual  Related Interventions Assign the client to read material on the cognitive-behavioral treatment approach to sleeplessness (e.g., Overcoming Insomnia: A Cognitive-Behavioral Therapy Approach Workbook by Silvio Pate and Carney; Say Good Night to Insomnia by Ardis Hughs). Objective Learn and implement relapse prevention practices. Target Date: 2023-02-08 Frequency: Biweekly  Progress: 0 Modality: individual  Related Interventions Discuss with the client the distinction between a lapse and relapse, associating a lapse with an occasional and reversible slip into old habits and relapse with the decision to return to old habits that risk sleep disturbance (e.g., poor sleep hygiene, poor stimulus control practices). Instruct the client to routinely use strategies learned in therapy (e.g., good sleep hygiene and stimulus control) to prevent relapse into habits  associated with sleep disturbance. Develop a "coping card" or other reminder where relapse prevention practices are recorded for the client's later use. Objective Practice good sleep hygiene. Target Date: 2023-02-08 Frequency: Biweekly  Progress: 0 Modality: individual  Related Interventions Instruct the client in sleep hygiene practices such as restricting excessive liquid intake, spicy late night snacks, or heavy evening meals; exercising regularly, but not within 3 - 4 hours of bedtime; minimizing or avoiding caffeine, alcohol, tobacco, and stimulant intake (or assign "Sleep Pattern Record" in the Adult Psychotherapy Homework Planner by [facility]). Objective Learn and implement stimulus control strategies to establish a consistent sleep-wake rhythm. Target Date: 2023-02-08 Frequency: Biweekly  Progress: 0 Modality: individual  Related Interventions Teach the client stimulus control techniques (e.g., lie down to sleep only when sleepy; do not use the bed for activities like watching television, reading, listening to music, but only for sleep or sexual activity; get out of bed if sleep doesn't arrive soon after retiring; lie back down when sleepy; set alarm to the same wake-up time every morning regardless of sleep time or quality; do not nap during the day); assign consistent implementation. Instruct the client to move activities associated with arousal and activation from the bedtime ritual to other times during the day (e.g., reading stimulating content, reviewing day's events, planning for next day, watching disturbing television). Monitor the client's sleep patterns and compliance with stimulus control instructions; problem-solve obstacles and reinforce successful, consistent implementation. 13. Terminate anxiety-producing dreams that cause awakening.   Diagnosis:Adjustment disorder with anxiety  Plan:  -meet again on Thursday, February 21, 2022 at [place], [facility]

## 2022-02-08 NOTE — Progress Notes (Addendum)
° ° ° ° ° ° ° ° ° ° ° ° ° ° °  Zora Glendenning, LCMHC °

## 2022-02-21 ENCOUNTER — Encounter: Payer: Self-pay | Admitting: Professional

## 2022-02-21 ENCOUNTER — Ambulatory Visit: Payer: BC Managed Care – PPO | Admitting: Professional

## 2022-02-21 DIAGNOSIS — F330 Major depressive disorder, recurrent, mild: Secondary | ICD-10-CM | POA: Insufficient documentation

## 2022-02-21 DIAGNOSIS — F4322 Adjustment disorder with anxiety: Secondary | ICD-10-CM | POA: Diagnosis not present

## 2022-02-21 NOTE — Progress Notes (Signed)
[facility] Counselor/Therapist Progress Note ? ?Patient ID: Brenda Roy, MRN: [id number],   ? ?Date: 02/21/2022 ? ?Time Spent: 51 minutes [phone] ? ?Treatment Type: Individual Therapy ? ?Risk Assessment: ?Danger to Self:  No ?Self-injurious Behavior: No ?Danger to Others: No ? ?Subjective: This session was held via video teletherapy due to the coronavirus risk at this time. The patient consented to video teletherapy and was located in her home office during this session. She is aware it is the responsibility of the patient to secure confidentiality on her end of the session. The provider was in a private home office for the duration of this session.  ? ?The patient arrived on time for her webex appointment.. ? 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  -she will participate in the activities ?  -this is the first meeting since Covid ?  -she will still need to work checking emails ?2-personal ?-the free time makes the grief harder ?-pt and er mother are getting in a better groove ?-she is to honor her father's life by doing things that he would have done ?  -inviting people into her home ?-she knows the jewish grieving process very well ?-she does not think she needs a Occupational hygienist ?-pt introduced me to her father ?  -her dad raised himself in [place] ?  -he was 23 and immigrated to the [place] ?-pt does not struggle with getting up everyday ?  -she likes her job and is doing something she feels is important ?-even though she misses him it doesn't stop me from living my life ?  -she will still go out even if she does not want to ?-you are only a direct mourner if you are a parent sibling or child ?  -her mom only had her parents and twin brothers no other relatives ?    -her surviving twin brother is not connected ?    -there was a falling out between the brothers ?    -his  wife  is a Diplomatic Services operational officer ?-an Chief Strategy Officer has written a book about him ?  -he was to meet with her the week before he died ?  -she has struggled to read the manuscript ?  -the [employer] [facility] wants them to fill in the gaps ?    -pt not ready to investigates and reach out to her aunt's in [place] ? ? ?Treatment Plan ?Problems Addressed  ?Anxiety, Grief / Loss Unresolved, Grief / Loss Unresolved, Sleep Disturbance  ?Goals ?1. Begin a healthy grieving process around the loss. ?2. Complete the process of letting go of the lost significant other. ?3. End abrupt awakening in terror and return to peaceful, restful sleep pattern. ?4. Enhance ability to effectively cope with the full variety of life's worries and anxieties. ?5. Feel refreshed and energetic during wakeful hours. ?6. Learn and implement coping skills that result in a reduction of anxiety and worry, and improved daily functioning. ?Objective ?Learn and implement calming skills to reduce overall anxiety and manage anxiety symptoms. ?Target Date: 2023-02-08 Frequency: Biweekly  ?Progress: 0 Modality: individual  ?Related Interventions ?Teach the client calming/relaxation skills (e.g., applied relaxation, progressive muscle relaxation, cue controlled relaxation; mindful breathing; biofeedback) and how to discriminate better between relaxation and tension; teach the client how to apply these skills to his/her daily life (e.g., New Directions in Progressive Muscle Relaxation by Casper Harrison, and Hazlett-Stevens; Treating  Generalized Anxiety Disorder by Rygh and Amparo Bristol). ?Assign the client homework each session in which he/she practices relaxation exercises daily, gradually applying them progressively from non-anxiety-provoking to anxiety-provoking situations; review and reinforce success while providing corrective feedback toward improvement. ?Objective ?Verbalize an understanding of the role that cognitive biases play in excessive irrational worry and persistent  anxiety symptoms. ?Target Date: 2023-02-08 Frequency: Biweekly  ?Progress: 0 Modality: individual  ?Related Interventions ?Assist the client in analyzing his/her worries by examining potential biases such as the probability of the negative expectation occurring, the real consequences of it occurring, his/her ability to control the outcome, the worst possible outcome, and his/her ability to accept it (see "Analyze the Probability of a Feared Event" in the Adult Psychotherapy Homework Planner by Bryn Gulling; Cognitive Therapy of Anxiety Disorders by Alison Stalling). ?Help the client gain insight into the notion that worry may function as a form of avoidance of a feared problem and that it creates acute and chronic tension. ?Objective ?Reestablish a consistent sleep-wake cycle. ?Target Date: 2023-02-08 Frequency: Biweekly  ?Progress: 0 Modality: individual  ?Related Interventions ?Teach and implement sleep hygiene practices to help the client reestablish a consistent sleep-wake cycle; review, reinforce success, and provide corrective feedback toward improvement. ?Objective ?Learn and implement problem-solving strategies for realistically addressing worries. ?Target Date: 2023-02-08 Frequency: Biweekly  ?Progress: 0 Modality: individual  ?Related Interventions ?Teach the client problem-solving strategies involving specifically defining a problem, generating options for addressing it, evaluating the pros and cons of each option, selecting and implementing an optional action, and reevaluating and refining the action (or assign "Applying Problem-Solving to Interpersonal Conflict" in the Adult Psychotherapy Homework Planner by Bryn Gulling). ?Objective ?Learn and implement personal and interpersonal skills to reduce anxiety and improve interpersonal relationships. ?Target Date: 2023-02-08 Frequency: Biweekly  ?Progress: 0 Modality: individual  ?Related Interventions ?Use instruction, modeling, and role-playing to build the client's  general social, communication, and/or conflict resolution skills. ?Assign the client a homework exercise in which he/she implements communication skills training into his/her daily life (or assign "Restoring Socialization Comfort" in the Adult Psychotherapy Homework Planner by [facility]); review, reinforce success, and provide corrective feedback toward improvement. ?Objective ?Acknowledge dependency on lost loved one and begin to refocus life on independent actions to meet emotional needs. ?Target Date: 2023-02-08 Frequency: Biweekly  ?Progress: 0 Modality: individual  ?Related Interventions ?Assist the client in identifying how he/she depended upon the significant other, expressing and resolving the accompanying feelings of abandonment and of being left alone. ?Explore the feelings of anger or guilt that surround the loss, helping the client understand the sources for such feelings. ?Objective ?Verbalize resolution of feelings of guilt and regret associated with the loss. ?Target Date: 2023-02-08 Frequency: Biweekly  ?Progress: 0 Modality: individual  ?Related Interventions ?Assign the client to make a list of all the regrets associated with actions toward or relationship with the deceased; process the list content toward resolution of these feelings. ?Objective ?Reengage in activities with family, friends, coworkers, and others. ?Target Date: 2023-02-08 Frequency: Biweekly  ?Progress: 0 Modality: individual  ?Related Interventions ?Assist the client in recommitting and reengaging in the primary social positive roles in which he/she has functioned prior to the loss. ?Promote behavioral activation by assisting the client in listing activities which he/she previously enjoyed but has not engaged in since experiencing the loss and then encourage reengagement in these activities (or assign "Identify and Schedule Pleasant Activities" in the Adult Psychotherapy Homework Planner by Bryn Gulling). ?Objective ?Begin verbalizing  feelings associated with the loss. ?Target  Date: 2023-02-08 Frequency: Biweekly  ?Progress: 0 Modality: individual  ?Related Interventions ?Ask the client to bring pictures or mementos connected with his/h

## 2022-03-06 ENCOUNTER — Encounter: Payer: Self-pay | Admitting: Professional

## 2022-03-06 ENCOUNTER — Ambulatory Visit (INDEPENDENT_AMBULATORY_CARE_PROVIDER_SITE_OTHER): Payer: BC Managed Care – PPO | Admitting: Professional

## 2022-03-06 DIAGNOSIS — F4322 Adjustment disorder with anxiety: Secondary | ICD-10-CM | POA: Diagnosis not present

## 2022-03-06 DIAGNOSIS — F330 Major depressive disorder, recurrent, mild: Secondary | ICD-10-CM

## 2022-03-06 NOTE — Progress Notes (Signed)
[facility] Counselor/Therapist Progress Note ? ?Patient ID: DEBRALEE BRAAKSMA, MRN: [id number],   ? ?Date: 03/06/2022 ? ?Time Spent: 42 minutes 8-842am ? ?Treatment Type: Individual Therapy ? ?Risk Assessment: ?Danger to Self:  No ?Self-injurious Behavior: No ?Danger to Others: No ? ?Subjective: This session was held via video teletherapy due to the coronavirus risk at this time. The patient consented to video teletherapy and was located in her home office during this session. She is aware it is the responsibility of the patient to secure confidentiality on her end of the session. The provider was in a private home office for the duration of this session.  ? ?The patient arrived on time for her webex appointment.. ? ?Issues addressed: ?1-professional ?-reports the conference went well ?-she had a very supportive group who provided the right amount of support ?-she slept like she slept prior to father's death ?-she felt great at conference ?-she is very busy ?2-personal ?-had a busy weekend with no time to rest ?-helped her mom clean out her closet ?-she has continued to not sleep well once home from conference ?-her emotional load is during the afternoon ?-yesterday was a disaster ?  -her mom had an eye procedure that went wrong ?    -had  ?-concerned about Seder Meal with [employer] of education Corene Cornea and his wife ?  -they both lost their fathers around the same age ?  -this will be at a different experience ?-pt does see herself wanting to be married and have children ?  -pt is ready to begin looking for a partner ?  -discussed values and non-negotiables ? ?Treatment Plan ?Problems Addressed  ?Anxiety, Grief / Loss Unresolved, Grief / Loss Unresolved, Sleep Disturbance  ?Goals ?1. Begin a healthy grieving process around the loss. ?2. Complete the process of letting go of the lost significant other. ?3. End abrupt awakening in terror and return to peaceful, restful sleep pattern. ?4. Enhance ability to  effectively cope with the full variety of life's worries and anxieties. ?5. Feel refreshed and energetic during wakeful hours. ?6. Learn and implement coping skills that result in a reduction of anxiety and worry, and improved daily functioning. ?Objective ?Learn and implement calming skills to reduce overall anxiety and manage anxiety symptoms. ?Target Date: 2023-02-08 Frequency: Biweekly  ?Progress: 0 Modality: individual  ?Related Interventions ?Teach the client calming/relaxation skills (e.g., applied relaxation, progressive muscle relaxation, cue controlled relaxation; mindful breathing; biofeedback) and how to discriminate better between relaxation and tension; teach the client how to apply these skills to his/her daily life (e.g., New Directions in Progressive Muscle Relaxation by Casper Harrison, and Hazlett-Stevens; Treating Generalized Anxiety Disorder by Rygh and Amparo Bristol). ?Assign the client homework each session in which he/she practices relaxation exercises daily, gradually applying them progressively from non-anxiety-provoking to anxiety-provoking situations; review and reinforce success while providing corrective feedback toward improvement. ?Objective ?Verbalize an understanding of the role that cognitive biases play in excessive irrational worry and persistent anxiety symptoms. ?Target Date: 2023-02-08 Frequency: Biweekly  ?Progress: 0 Modality: individual  ?Related Interventions ?Assist the client in analyzing his/her worries by examining potential biases such as the probability of the negative expectation occurring, the real consequences of it occurring, his/her ability to control the outcome, the worst possible outcome, and his/her ability to accept it (see "Analyze the Probability of a Feared Event" in the Adult Psychotherapy Homework Planner by Bryn Gulling; Cognitive Therapy of Anxiety Disorders by Alison Stalling). ?Help the client gain insight into the notion that worry  may function as a  form of avoidance of a feared problem and that it creates acute and chronic tension. ?Objective ?Reestablish a consistent sleep-wake cycle. ?Target Date: 2023-02-08 Frequency: Biweekly  ?Progress: 0 Modality: individual  ?Related Interventions ?Teach and implement sleep hygiene practices to help the client reestablish a consistent sleep-wake cycle; review, reinforce success, and provide corrective feedback toward improvement. ?Objective ?Learn and implement problem-solving strategies for realistically addressing worries. ?Target Date: 2023-02-08 Frequency: Biweekly  ?Progress: 0 Modality: individual  ?Related Interventions ?Teach the client problem-solving strategies involving specifically defining a problem, generating options for addressing it, evaluating the pros and cons of each option, selecting and implementing an optional action, and reevaluating and refining the action (or assign "Applying Problem-Solving to Interpersonal Conflict" in the Adult Psychotherapy Homework Planner by Bryn Gulling). ?Objective ?Learn and implement personal and interpersonal skills to reduce anxiety and improve interpersonal relationships. ?Target Date: 2023-02-08 Frequency: Biweekly  ?Progress: 0 Modality: individual  ?Related Interventions ?Use instruction, modeling, and role-playing to build the client's general social, communication, and/or conflict resolution skills. ?Assign the client a homework exercise in which he/she implements communication skills training into his/her daily life (or assign "Restoring Socialization Comfort" in the Adult Psychotherapy Homework Planner by [facility]); review, reinforce success, and provide corrective feedback toward improvement. ?Objective ?Acknowledge dependency on lost loved one and begin to refocus life on independent actions to meet emotional needs. ?Target Date: 2023-02-08 Frequency: Biweekly  ?Progress: 0 Modality: individual  ?Related Interventions ?Assist the client in identifying how he/she  depended upon the significant other, expressing and resolving the accompanying feelings of abandonment and of being left alone. ?Explore the feelings of anger or guilt that surround the loss, helping the client understand the sources for such feelings. ?Objective ?Verbalize resolution of feelings of guilt and regret associated with the loss. ?Target Date: 2023-02-08 Frequency: Biweekly  ?Progress: 0 Modality: individual  ?Related Interventions ?Assign the client to make a list of all the regrets associated with actions toward or relationship with the deceased; process the list content toward resolution of these feelings. ?Objective ?Reengage in activities with family, friends, coworkers, and others. ?Target Date: 2023-02-08 Frequency: Biweekly  ?Progress: 0 Modality: individual  ?Related Interventions ?Assist the client in recommitting and reengaging in the primary social positive roles in which he/she has functioned prior to the loss. ?Promote behavioral activation by assisting the client in listing activities which he/she previously enjoyed but has not engaged in since experiencing the loss and then encourage reengagement in these activities (or assign "Identify and Schedule Pleasant Activities" in the Adult Psychotherapy Homework Planner by Bryn Gulling). ?Objective ?Begin verbalizing feelings associated with the loss. ?Target Date: 2023-02-08 Frequency: Biweekly  ?Progress: 0 Modality: individual  ?Related Interventions ?Ask the client to bring pictures or mementos connected with his/her loss to a session and talk about them (or assign "Creating a [employer]" in the Adult Psychotherapy Homework Planner by Bryn Gulling). ?Assist the client in identifying and expressing feelings connected with his/her loss. ?7. Reduce overall frequency, intensity, and duration of the anxiety so that daily functioning is not impaired. ?8. Resolve the core conflict that is the source of anxiety. ?9. Restore restful sleep  pattern. ?Objective ?Learn and implement skills for managing stresses contributing to the sleep problem. ?Target Date: 2023-02-08 Frequency: Biweekly  ?Progress: 0 Modality: individual  ?Related Interventions ?Use cogni

## 2022-03-20 ENCOUNTER — Encounter: Payer: Self-pay | Admitting: Professional

## 2022-03-20 ENCOUNTER — Ambulatory Visit: Payer: BC Managed Care – PPO | Admitting: Professional

## 2022-03-20 DIAGNOSIS — F4322 Adjustment disorder with anxiety: Secondary | ICD-10-CM | POA: Diagnosis not present

## 2022-03-20 DIAGNOSIS — F330 Major depressive disorder, recurrent, mild: Secondary | ICD-10-CM | POA: Diagnosis not present

## 2022-03-20 NOTE — Progress Notes (Signed)
[facility] Counselor/Therapist Progress Note ? ?Patient ID: Brenda Roy, MRN: [id number],   ? ?Date: 03/20/2022 ? ?Time Spent: 44 minutes [id number] ? ?Treatment Type: Individual Therapy ? ?Risk Assessment: ?Danger to Self:  No ?Self-injurious Behavior: No ?Danger to Others: No ? ?Subjective: This session was held via video teletherapy due to the coronavirus risk at this time. The patient consented to video teletherapy and was located in her home office during this session. She is aware it is the responsibility of the patient to secure confidentiality on her end of the session. The provider was in a private home office for the duration of this session.  ? ?The patient arrived on time for her webex appointment.. ? ?Issues addressed: ?1-professional ?-work volume has been heavier than usual ?-contract negotiations are beginning for her personally ?-recently hired a new Rabbi ?-current Jake Bathe was on vacation for three weeks  ?2-personal ?-mother never went way to visit her brother ?-noticing that she has been really tired ?-she has had low energy ?-feels very tired and does not feel refreshed ?-itching on her chest and torso ?-feeling like she wants to isolate ?-change in appetite ?  -needs to work on diet ?  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 ?Treatment Plan ?Problems Addressed  ?Anxiety, Grief / Loss Unresolved, Grief / Loss Unresolved, Sleep Disturbance  ?Goals ?1. Begin a healthy grieving process around the loss. ?2. Complete the process of letting go of the lost significant other. ?3. End abrupt awakening in terror and return to peaceful, restful sleep pattern. ?4. Enhance ability to effectively cope with the full variety of life's worries and anxieties. ?5. Feel refreshed and  energetic during wakeful hours. ?6. Learn and implement coping skills that result in a reduction of anxiety and worry, and improved daily functioning. ?Objective ?Learn and implement calming skills to reduce overall anxiety and manage anxiety symptoms. ?Target Date: 2023-02-08 Frequency: Biweekly  ?Progress: 0 Modality: individual  ?Related Interventions ?Teach the client calming/relaxation skills (e.g., applied relaxation, progressive muscle relaxation, cue controlled relaxation; mindful breathing; biofeedback) and how to discriminate better between relaxation and tension; teach the client how to apply these skills to his/her daily life (e.g., New Directions in Progressive Muscle Relaxation by Casper Harrison, and Hazlett-Stevens; Treating Generalized Anxiety Disorder by Rygh and Amparo Bristol). ?Assign the client homework each session in which he/she practices relaxation exercises daily, gradually applying them progressively from non-anxiety-provoking to anxiety-provoking situations; review and reinforce success while providing corrective feedback toward improvement. ?Objective ?Verbalize an understanding of the role that cognitive biases play in excessive irrational worry and persistent anxiety symptoms. ?Target Date: 2023-02-08 Frequency: Biweekly  ?Progress: 0 Modality: individual  ?Related Interventions ?Assist the client in analyzing his/her worries by examining potential biases such as the probability of the negative expectation occurring, the real consequences of it occurring, his/her ability to control the outcome, the worst possible outcome, and his/her ability to accept it (see "Analyze the Probability of a Feared Event" in the Adult Psychotherapy Homework Planner by Bryn Gulling; Cognitive Therapy of Anxiety Disorders by Alison Stalling). ?Help the client gain insight into the notion that worry may function as a form of avoidance of a feared problem and that it creates acute and chronic  tension. ?Objective ?Reestablish a consistent sleep-wake cycle. ?  Target Date: 2023-02-08 Frequency: Biweekly  ?Progress: 0 Modality: individual  ?Related Interventions ?Teach and implement sleep hygiene practices to help the client reestablish a consistent sleep-wake cycle; review, reinforce success, and provide corrective feedback toward improvement. ?Objective ?Learn and implement problem-solving strategies for realistically addressing worries. ?Target Date: 2023-02-08 Frequency: Biweekly  ?Progress: 0 Modality: individual  ?Related Interventions ?Teach the client problem-solving strategies involving specifically defining a problem, generating options for addressing it, evaluating the pros and cons of each option, selecting and implementing an optional action, and reevaluating and refining the action (or assign "Applying Problem-Solving to Interpersonal Conflict" in the Adult Psychotherapy Homework Planner by Bryn Gulling). ?Objective ?Learn and implement personal and interpersonal skills to reduce anxiety and improve interpersonal relationships. ?Target Date: 2023-02-08 Frequency: Biweekly  ?Progress: 0 Modality: individual  ?Related Interventions ?Use instruction, modeling, and role-playing to build the client's general social, communication, and/or conflict resolution skills. ?Assign the client a homework exercise in which he/she implements communication skills training into his/her daily life (or assign "Restoring Socialization Comfort" in the Adult Psychotherapy Homework Planner by [facility]); review, reinforce success, and provide corrective feedback toward improvement. ?Objective ?Acknowledge dependency on lost loved one and begin to refocus life on independent actions to meet emotional needs. ?Target Date: 2023-02-08 Frequency: Biweekly  ?Progress: 0 Modality: individual  ?Related Interventions ?Assist the client in identifying how he/she depended upon the significant other, expressing and resolving the  accompanying feelings of abandonment and of being left alone. ?Explore the feelings of anger or guilt that surround the loss, helping the client understand the sources for such feelings. ?Objective ?Verbalize resolution of feelings of guilt and regret associated with the loss. ?Target Date: 2023-02-08 Frequency: Biweekly  ?Progress: 0 Modality: individual  ?Related Interventions ?Assign the client to make a list of all the regrets associated with actions toward or relationship with the deceased; process the list content toward resolution of these feelings. ?Objective ?Reengage in activities with family, friends, coworkers, and others. ?Target Date: 2023-02-08 Frequency: Biweekly  ?Progress: 0 Modality: individual  ?Related Interventions ?Assist the client in recommitting and reengaging in the primary social positive roles in which he/she has functioned prior to the loss. ?Promote behavioral activation by assisting the client in listing activities which he/she previously enjoyed but has not engaged in since experiencing the loss and then encourage reengagement in these activities (or assign "Identify and Schedule Pleasant Activities" in the Adult Psychotherapy Homework Planner by Bryn Gulling). ?Objective ?Begin verbalizing feelings associated with the loss. ?Target Date: 2023-02-08 Frequency: Biweekly  ?Progress: 0 Modality: individual  ?Related Interventions ?Ask the client to bring pictures or mementos connected with his/her loss to a session and talk about them (or assign "Creating a [employer]" in the Adult Psychotherapy Homework Planner by Bryn Gulling). ?Assist the client in identifying and expressing feelings connected with his/her loss. ?7. Reduce overall frequency, intensity, and duration of the anxiety so that daily functioning is not impaired. ?8. Resolve the core conflict that is the source of anxiety. ?9. Restore restful sleep pattern. ?Objective ?Learn and implement skills for managing stresses  contributing to the sleep problem. ?Target Date: 2023-02-08 Frequency: Biweekly  ?Progress: 0 Modality: individual  ?Related Interventions ?Use cognitive behavioral skills training techniques (e.g., instruction, covert modeling [i.e., imagining the

## 2022-03-21 ENCOUNTER — Ambulatory Visit: Payer: BC Managed Care – PPO | Admitting: Internal Medicine

## 2022-03-21 ENCOUNTER — Ambulatory Visit (INDEPENDENT_AMBULATORY_CARE_PROVIDER_SITE_OTHER): Payer: BC Managed Care – PPO | Admitting: Internal Medicine

## 2022-03-21 ENCOUNTER — Other Ambulatory Visit: Payer: BC Managed Care – PPO

## 2022-03-21 ENCOUNTER — Encounter: Payer: Self-pay | Admitting: Internal Medicine

## 2022-03-21 VITALS — BP 126/82 | HR 87 | Temp 98.9°F | Ht 65.0 in | Wt 214.8 lb

## 2022-03-21 DIAGNOSIS — F330 Major depressive disorder, recurrent, mild: Secondary | ICD-10-CM

## 2022-03-21 DIAGNOSIS — F4321 Adjustment disorder with depressed mood: Secondary | ICD-10-CM | POA: Diagnosis not present

## 2022-03-21 DIAGNOSIS — R5383 Other fatigue: Secondary | ICD-10-CM | POA: Diagnosis not present

## 2022-03-21 DIAGNOSIS — Z0000 Encounter for general adult medical examination without abnormal findings: Secondary | ICD-10-CM

## 2022-03-21 DIAGNOSIS — E7800 Pure hypercholesterolemia, unspecified: Secondary | ICD-10-CM | POA: Diagnosis not present

## 2022-03-21 DIAGNOSIS — R319 Hematuria, unspecified: Secondary | ICD-10-CM | POA: Diagnosis not present

## 2022-03-21 DIAGNOSIS — E559 Vitamin D deficiency, unspecified: Secondary | ICD-10-CM | POA: Diagnosis not present

## 2022-03-21 LAB — POCT URINALYSIS DIPSTICK
Bilirubin, UA: NEGATIVE
Glucose, UA: NEGATIVE
Ketones, UA: NEGATIVE
Leukocytes, UA: NEGATIVE
Nitrite, UA: NEGATIVE
Protein, UA: NEGATIVE
Spec Grav, UA: 1.015 (ref 1.010–1.025)
Urobilinogen, UA: 0.2 E.U./dL
pH, UA: 5 (ref 5.0–8.0)

## 2022-03-21 NOTE — Progress Notes (Incomplete)
? ?  Subjective:  ? ? Patient ID: Brenda Roy, female    DOB: 1986/11/28, 36 y.o.   MRN: [id number] ? ?HPI ? ? ? ?Review of Systems ? ?   ?Objective:  ? Physical Exam ? ? ? ? ?   ?Assessment & Plan:  ? ? ?

## 2022-03-22 ENCOUNTER — Other Ambulatory Visit: Payer: Self-pay

## 2022-03-22 LAB — COMPLETE METABOLIC PANEL WITH GFR
AG Ratio: 1.4 (calc) (ref 1.0–2.5)
ALT: 16 U/L (ref 6–29)
AST: 15 U/L (ref 10–30)
Albumin: 4.2 g/dL (ref 3.6–5.1)
Alkaline phosphatase (APISO): 66 U/L (ref 31–125)
BUN/Creatinine Ratio: 17 (calc) (ref 6–22)
BUN: 17 mg/dL (ref 7–25)
CO2: 20 mmol/L (ref 20–32)
Calcium: 9.4 mg/dL (ref 8.6–10.2)
Chloride: 105 mmol/L (ref 98–110)
Creat: 1 mg/dL — ABNORMAL HIGH (ref 0.50–0.97)
Globulin: 2.9 g/dL (calc) (ref 1.9–3.7)
Glucose, Bld: 82 mg/dL (ref 65–139)
Potassium: 4.4 mmol/L (ref 3.5–5.3)
Sodium: 136 mmol/L (ref 135–146)
Total Bilirubin: 0.4 mg/dL (ref 0.2–1.2)
Total Protein: 7.1 g/dL (ref 6.1–8.1)
eGFR: 75 mL/min/{1.73_m2} (ref >=60)

## 2022-03-22 LAB — CBC WITH DIFFERENTIAL/PLATELET
Absolute Monocytes: 405 cells/uL (ref 200–950)
Basophils Absolute: 8 cells/uL (ref 0–200)
Basophils Relative: 0.1 %
Eosinophils Absolute: 122 cells/uL (ref 15–500)
Eosinophils Relative: 1.5 %
HCT: 38.3 % (ref 35.0–45.0)
Hemoglobin: 13.2 g/dL (ref 11.7–15.5)
Lymphs Abs: 2543 cells/uL (ref 850–3900)
MCH: 30.1 pg (ref 27.0–33.0)
MCHC: 34.5 g/dL (ref 32.0–36.0)
MCV: 87.4 fL (ref 80.0–100.0)
MPV: 11 fL (ref 7.5–12.5)
Monocytes Relative: 5 %
Neutro Abs: 5022 cells/uL (ref 1500–7800)
Neutrophils Relative %: 62 %
Platelets: 259 10*3/uL (ref 140–400)
RBC: 4.38 10*6/uL (ref 3.80–5.10)
RDW: 13 % (ref 11.0–15.0)
Total Lymphocyte: 31.4 %
WBC: 8.1 10*3/uL (ref 3.8–10.8)

## 2022-03-22 LAB — VITAMIN D 25 HYDROXY (VIT D DEFICIENCY, FRACTURES): Vit D, 25-Hydroxy: 22 ng/mL — ABNORMAL LOW (ref 30–100)

## 2022-03-22 LAB — URINE CULTURE
MICRO NUMBER:: 13290151
SPECIMEN QUALITY:: ADEQUATE

## 2022-03-22 LAB — URINALYSIS, MICROSCOPIC ONLY
Bacteria, UA: NONE SEEN /HPF
Hyaline Cast: NONE SEEN /LPF
RBC / HPF: NONE SEEN /HPF (ref 0–2)
Squamous Epithelial / HPF: NONE SEEN /HPF (ref <=5)
WBC, UA: NONE SEEN /HPF (ref 0–5)

## 2022-03-22 LAB — TSH: TSH: 2.23 mIU/L

## 2022-03-22 MED ORDER — ERGOCALCIFEROL 1.25 MG (50000 UT) PO CAPS: 50000.0000 [IU] | ORAL_CAPSULE | ORAL | 1 refills | Status: DC

## 2022-03-26 ENCOUNTER — Other Ambulatory Visit: Payer: BC Managed Care – PPO

## 2022-03-26 DIAGNOSIS — E7800 Pure hypercholesterolemia, unspecified: Secondary | ICD-10-CM

## 2022-03-26 LAB — LIPID PANEL
Cholesterol: 210 mg/dL — ABNORMAL HIGH (ref <200)
HDL: 64 mg/dL (ref >=50)
LDL Cholesterol (Calc): 120 mg/dL (calc) — ABNORMAL HIGH
Non-HDL Cholesterol (Calc): 146 mg/dL (calc) — ABNORMAL HIGH (ref <130)
Total CHOL/HDL Ratio: 3.3 (calc) (ref <5.0)
Triglycerides: 148 mg/dL (ref <150)

## 2022-03-29 DIAGNOSIS — T7840XA Allergy, unspecified, initial encounter: Secondary | ICD-10-CM | POA: Diagnosis not present

## 2022-03-29 DIAGNOSIS — L309 Dermatitis, unspecified: Secondary | ICD-10-CM | POA: Diagnosis not present

## 2022-03-31 ENCOUNTER — Encounter: Payer: Self-pay | Admitting: Internal Medicine

## 2022-03-31 NOTE — Progress Notes (Signed)
See lab results.  

## 2022-03-31 NOTE — Progress Notes (Signed)
? ?  Subjective:  ? ? Patient ID: Brenda Roy, female    DOB: 11/01/1986, 36 y.o.   MRN: [id number] ? ?HPI 36 year old Female seen today for health maintenance exam.  She has been having some issues with fatigue.  Her father passed away recently due to a sudden event.  She has been trying to help her mother and patient has been going through the grieving process in accordance with her faith.  Has been going to counseling through [employer].  Has been able to go to work and get activities accomplished. ? ?She had GYN exam with Dr. Rogue Bussing July 02, 2021.  Pap smear is up-to-date.  HPV vaccine in 2014 according to GYN records. ? ?She has a history of functional constipation. ? ?In February 2020 she had GE reflux and esophagitis.  H. pylori testing was negative and she was placed on Carafate and PPI and subsequently improved. ? ?History of migraine headaches.  She had benign left breast mass removed while living in [place]. ? ?Social history: She works as Development worker, international aid for [employer].  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  Single.  Never married.  Does not smoke.  Does not consume alcohol. ? ?Review of Systems see above-is getting some sleep and trying to take care of herself.  Has fatigue. ? ?Remote history of eustachian tube dysfunction seen in 2020 by ENT. ? ?History of plantar fasciitis right heel in 2021. ? ?   ?Objective:  ? Physical Exam ?Skin: Warm and dry.  Nodes none.  TMs are clear.  Pharynx is clear.  Chest is clear.  Cardiac exam regular rate and rhythm without ectopy.  Abdomen is soft nondistended without hepatosplenomegaly masses or tenderness.  No lower extremity pitting edema.  Brief neurological exam is intact without gross focal deficits. ? ? ? ?   ?Assessment & Plan:  ?Grief reaction ? ?Fatigue ? ?Plan we drew CBC with differential, c-Met, TSH, vitamin D level and fasting lipid panel.  She has mild elevation of LDL which she had in 2020.  Total cholesterol was  210.  Liver functions are normal.  Creatinine is 1.00 but I believe that is within normal limits.  Glucose is 82.  CBC is normal.  TSH is normal.  Vitamin D level is low at 22.  She will be placed on weekly high-dose vitamin D. ? ?Overall I believe she is doing well.  She will take vitamin D weekly.  She knows she can call with questions or concerns. ?

## 2022-03-31 NOTE — Patient Instructions (Signed)
Please take vitamin D weekly.  We are sorry to hear you are not feeling well today.  Our condolences to you and the loss of your father.  Please let me know if you need anything at all. ?

## 2022-04-08 ENCOUNTER — Ambulatory Visit: Payer: BC Managed Care – PPO | Admitting: Professional

## 2022-04-08 DIAGNOSIS — F330 Major depressive disorder, recurrent, mild: Secondary | ICD-10-CM | POA: Diagnosis not present

## 2022-04-08 DIAGNOSIS — F4322 Adjustment disorder with anxiety: Secondary | ICD-10-CM | POA: Diagnosis not present

## 2022-04-08 NOTE — Progress Notes (Signed)
[facility] Counselor/Therapist Progress Note ? ?Patient ID: Brenda Roy, MRN: [id number],   ? ?Date: 04/08/2022 ? ?Time Spent: 39 minutes 901-940am ? ?Treatment Type: Individual Therapy ? ?Risk Assessment: ?Danger to Self:  No ?Self-injurious Behavior: No ?Danger to Others: No ? ?Subjective: This session was held via video teletherapy due to the coronavirus risk at this time. The patient consented to video teletherapy and was located in her home office during this session. She is aware it is the responsibility of the patient to secure confidentiality on her end of the session. The provider was in a private home office for the duration of this session.  ? ?The patient arrived on time for her webex appointment. ? 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  -they are planning to do weekend activities ?  -she had a hard day for her birthday since it was her first without her father ?    -birthdays were always a big deal with her parents ?    -she hung out with friends ?  -she is depressed 12/10 ?-her mother did go to visit her brother and she came back early ?b-pt keeps her relationship with her brother because of her nephews ?  -in the event anything happened she would take custody of her two nephews ?  -her sister-in-laws parents will manage the money ?  -the pt has a very good relationship with her parents ?    -she talks to them every week ?c-activity level ?-have not missed a single gym day starting last week ?  -does not notice any improvement in sleep due to her rest ?-Saturdays is her day of rest ?d-sleep ?-is able to fall asleep quickly ?-notices she awakens often but goes right back to sleep ?-is actively  dreaming and ponders those dreams when she awakens and sometimes throughout the day ?  -her dreams are not distressing ?  -suggested that she could write her dreams down if she does not want to "carry them" ? ?Treatment Plan ?Problems Addressed  ?Anxiety, Grief / Loss Unresolved, Grief / Loss Unresolved, Sleep Disturbance  ?Goals ?1. Begin a healthy grieving process around the loss. ?2. Complete the process of letting go of the lost significant other. ?3. End abrupt awakening in terror and return to peaceful, restful sleep pattern. ?4. Enhance ability to effectively cope with the full variety of life's worries and anxieties. ?5. Feel refreshed and energetic during wakeful hours. ?6. Learn and implement coping skills that result in a reduction of anxiety and worry, and improved daily functioning. ?Objective ?Learn and implement calming skills to reduce overall anxiety and manage anxiety symptoms. ?Target Date: 2023-02-08 Frequency: Biweekly  ?Progress: 0 Modality: individual  ?Related Interventions ?Teach the client calming/relaxation skills (e.g., applied relaxation, progressive muscle relaxation, cue controlled relaxation; mindful breathing; biofeedback) and how to discriminate better between relaxation and tension; teach the client how to apply these skills to his/her daily life (e.g., New Directions in Progressive Muscle Relaxation by Casper Harrison, and Hazlett-Stevens; Treating Generalized Anxiety Disorder by Rygh and Amparo Bristol). ?Assign the client homework each session in which he/she practices relaxation exercises daily, gradually applying them progressively from non-anxiety-provoking to anxiety-provoking situations; review and reinforce success while providing corrective  feedback toward improvement. ?Objective ?Verbalize an understanding of the role that cognitive biases play in excessive irrational worry and persistent anxiety symptoms. ?Target Date: 2023-02-08 Frequency: Biweekly  ?Progress: 0  Modality: individual  ?Related Interventions ?Assist the client in analyzing his/her worries by examining potential biases such as the probability of the negative expectation occurring, the real consequences of it occurring, his/her ability to control the outcome, the worst possible outcome, and his/her ability to accept it (see "Analyze the Probability of a Feared Event" in the Adult Psychotherapy Homework Planner by Bryn Gulling; Cognitive Therapy of Anxiety Disorders by Alison Stalling). ?Help the client gain insight into the notion that worry may function as a form of avoidance of a feared problem and that it creates acute and chronic tension. ?Objective ?Reestablish a consistent sleep-wake cycle. ?Target Date: 2023-02-08 Frequency: Biweekly  ?Progress: 0 Modality: individual  ?Related Interventions ?Teach and implement sleep hygiene practices to help the client reestablish a consistent sleep-wake cycle; review, reinforce success, and provide corrective feedback toward improvement. ?Objective ?Learn and implement problem-solving strategies for realistically addressing worries. ?Target Date: 2023-02-08 Frequency: Biweekly  ?Progress: 0 Modality: individual  ?Related Interventions ?Teach the client problem-solving strategies involving specifically defining a problem, generating options for addressing it, evaluating the pros and cons of each option, selecting and implementing an optional action, and reevaluating and refining the action (or assign "Applying Problem-Solving to Interpersonal Conflict" in the Adult Psychotherapy Homework Planner by Bryn Gulling). ?Objective ?Learn and implement personal and interpersonal skills to reduce anxiety and improve interpersonal relationships. ?Target Date: 2023-02-08 Frequency: Biweekly  ?Progress: 0 Modality: individual  ?Related Interventions ?Use instruction, modeling, and role-playing to build the client's general social, communication, and/or conflict resolution skills. ?Assign the  client a homework exercise in which he/she implements communication skills training into his/her daily life (or assign "Restoring Socialization Comfort" in the Adult Psychotherapy Homework Planner by [facility]); review, reinforce success, and provide corrective feedback toward improvement. ?Objective ?Acknowledge dependency on lost loved one and begin to refocus life on independent actions to meet emotional needs. ?Target Date: 2023-02-08 Frequency: Biweekly  ?Progress: 0 Modality: individual  ?Related Interventions ?Assist the client in identifying how he/she depended upon the significant other, expressing and resolving the accompanying feelings of abandonment and of being left alone. ?Explore the feelings of anger or guilt that surround the loss, helping the client understand the sources for such feelings. ?Objective ?Verbalize resolution of feelings of guilt and regret associated with the loss. ?Target Date: 2023-02-08 Frequency: Biweekly  ?Progress: 0 Modality: individual  ?Related Interventions ?Assign the client to make a list of all the regrets associated with actions toward or relationship with the deceased; process the list content toward resolution of these feelings. ?Objective ?Reengage in activities with family, friends, coworkers, and others. ?Target Date: 2023-02-08 Frequency: Biweekly  ?Progress: 0 Modality: individual  ?Related Interventions ?Assist the client in recommitting and reengaging in the primary social positive roles in which he/she has functioned prior to the loss. ?Promote behavioral activation by assisting the client in listing activities which he/she previously enjoyed but has not engaged in since experiencing the loss and then encourage reengagement in these activities (or assign "Identify and Schedule Pleasant Activities" in the Adult Psychotherapy Homework Planner by Bryn Gulling). ?Objective ?Begin verbalizing feelings associated with the loss. ?Target Date: 2023-02-08 Frequency: Biweekly   ?Progress: 0 Modality: individual  ?Related Interventions ?Ask the client to bring pictures or mementos connected with his/her loss to a session and talk about them (or assign "Creating a [employer]  Collage"

## 2022-04-10 ENCOUNTER — Telehealth: Payer: Self-pay | Admitting: Internal Medicine

## 2022-04-10 NOTE — Telephone Encounter (Signed)
[facility] ?[phone] ? ?Brenda Roy called and said she had been to dermatologist about her rash and they had done labs and suggested she get back in contact with PCP because her creatinine was 102 on 03/29/22. She has ask them to send office notes and lab results to our office. ?

## 2022-04-10 NOTE — Telephone Encounter (Signed)
Called patient and scheduled.

## 2022-04-12 ENCOUNTER — Other Ambulatory Visit: Payer: BC Managed Care – PPO

## 2022-04-12 DIAGNOSIS — R7989 Other specified abnormal findings of blood chemistry: Secondary | ICD-10-CM | POA: Diagnosis not present

## 2022-04-13 LAB — BASIC METABOLIC PANEL
BUN/Creatinine Ratio: 15 (calc) (ref 6–22)
BUN: 15 mg/dL (ref 7–25)
CO2: 23 mmol/L (ref 20–32)
Calcium: 9.4 mg/dL (ref 8.6–10.2)
Chloride: 107 mmol/L (ref 98–110)
Creat: 0.99 mg/dL — ABNORMAL HIGH (ref 0.50–0.97)
Glucose, Bld: 87 mg/dL (ref 65–139)
Potassium: 4.4 mmol/L (ref 3.5–5.3)
Sodium: 139 mmol/L (ref 135–146)

## 2022-04-15 ENCOUNTER — Ambulatory Visit: Payer: BC Managed Care – PPO | Admitting: Internal Medicine

## 2022-04-15 ENCOUNTER — Encounter: Payer: Self-pay | Admitting: Internal Medicine

## 2022-04-15 VITALS — BP 112/80 | HR 77 | Temp 98.5°F | Wt 214.0 lb

## 2022-04-15 DIAGNOSIS — R7989 Other specified abnormal findings of blood chemistry: Secondary | ICD-10-CM

## 2022-04-15 NOTE — Progress Notes (Signed)
? ?  Subjective:  ? ? Patient ID: Brenda Roy, female    DOB: Aug 02, 1986, 36 y.o.   MRN: [id number] ? ?HPI Patient here today for follow-up on mildly elevated serum creatinine.  In 2023/01/18, her father passed away suddenly.  She has been in counseling.  She is doing better.  In April she was seen for health maintenance exam.  She was having some issues with fatigue.  ? ? She works as Development worker, international aid for [employer].  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  She does not smoke or consume alcohol. ? ?Recently has had issues with pruritic rash.  Dermatologist whom she saw regarding a rash recently and was placed on prednisone was concerned about her creatinine which was 1.00 in late April normal being up to 0.97.  We repeated it recently on May 12 and it was 0.99.  She says she was well-hydrated on that day.  She does not take large quantities of over-the-counter analgesic medication at all.  No history of prescription NSAID use.  She seldom takes over-the-counter analgesics. ? ?Creatinine was 1.92 at Dermatology office in April.  At that time liver functions were normal and serum proteins were normal.  Potassium was normal at 4.8.  ? ?Review of Systems see above-she has no symptoms of urinary frequency or hematuria.  No recent respiratory infections such as sore throat or febrile illness. ? ?   ?Objective:  ? Physical Exam ?Blood pressure 112/80 pulse 77 temperature 98.5 degrees pulse oximetry 98% weight 214 pounds BMI 35.61 ? ?She is having pruritus in the office today but there is no frank rash just some scattered erythema of her upper extremities. ? ? ? ?   ?Assessment & Plan:  ?Very mild elevation of serum creatinine-her Dermatologist is worried about this.  We will order renal ultrasound and refer her to [facility] ? ?Grief-could be aggravating pruritus ? ?Pruritus-currently being seen by Dr. Jari Pigg, Dermatologist ? ?

## 2022-04-15 NOTE — Patient Instructions (Signed)
Patient will call dermatologist about recurrent itching.  It is present today.  Ultrasound of the kidneys has been ordered.  We will make referral to [facility]. ?

## 2022-04-16 ENCOUNTER — Ambulatory Visit
Admission: RE | Admit: 2022-04-16 | Discharge: 2022-04-16 | Disposition: A | Discharge status: Home / Self Care | Payer: BC Managed Care – PPO | Source: Ambulatory Visit | Attending: Internal Medicine | Admitting: Internal Medicine

## 2022-04-16 DIAGNOSIS — R7989 Other specified abnormal findings of blood chemistry: Secondary | ICD-10-CM | POA: Diagnosis not present

## 2022-06-10 ENCOUNTER — Encounter: Payer: Self-pay | Admitting: Skilled Nursing Facility1

## 2022-06-10 ENCOUNTER — Encounter: Payer: BC Managed Care – PPO | Attending: Internal Medicine | Admitting: Skilled Nursing Facility1

## 2022-06-10 DIAGNOSIS — E785 Hyperlipidemia, unspecified: Secondary | ICD-10-CM | POA: Diagnosis not present

## 2022-06-10 DIAGNOSIS — Z713 Dietary counseling and surveillance: Secondary | ICD-10-CM | POA: Insufficient documentation

## 2022-06-10 DIAGNOSIS — E7800 Pure hypercholesterolemia, unspecified: Secondary | ICD-10-CM

## 2022-06-10 NOTE — Progress Notes (Signed)
Medical Nutrition Therapy   Primary concerns today: high cholesterol  Referral diagnosis: e78.5 Preferred learning style: auditory, visual Learning readiness: ready   NUTRITION ASSESSMENT    Clinical Medical Hx: hypercholesterolemia  Medications: see list Labs: creatinine .99, cholesterol 210, LDL 120 Notable Signs/Symptoms: N/A  Lifestyle & Dietary Hx  Pt states she used to work out but since her fathers death she has not been active. Pt states she works 7 days a week currently but hoping this will get better. 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   Pt states she drinks 16 ounces of fluid total per day satting she has never drank more than that since she was a kid: Dietitian educated pt this affcted her renal labs, IBS-c, amongst many other health concerns for not meeting aopproiate fluid goals.  Pt already makes good choices for cholesterol control just needs to tweak fluid intake and make her dinner meal from home.   Estimated daily fluid intake: 16 oz Supplements: vitamin D weekly Sleep: 7 hours not rested but getting better Stress / self-care: very high stress level-grieving and work stress Current average weekly physical activity: ADL's  24-Hr Dietary Recall First Meal: low fat cottage cheese or yogurt with fruit Snack: fruit Second Meal: mythos salad with chicken or tuna salad or tofu and veggies Snack:  Third Meal: pizza or hamburger or pasta or burrito Snack:  Beverages: water, Gatorade zero, sometimes latte or coffee   NUTRITION INTERVENTION  Nutrition education (E-1) on the following topics:  Saturated fat and cholesterol Unsaturated fat and cholesterol Fiber and cholesterol Omega 3 fatty acids Proper hydration Purpose of hydration: Water makes up over 50% of your total body water, and is part of many organs throughout the body. Water is  essential to transport digested nutrients, regulate body temperature, rid the body of waste products, and protects joints and the spinal cord. When not properly hydrated you will begin to experience headaches, cramps and dizziness. Further dehydration can result in rapid heart rate, shock, oliguria, and may cause seizures.  https://www.merckmanuals.com/home/hormonal-and-metabolic-disordehttps://www.usgs.gov/special-topic/water-science-school/science/water-you-water-and-human-body?qt-science_center_objects=0#qt-science_center_objectsrs/water-balance/about-body-water HistoricalGrowth.gl https://www.stevens.org/ PimpTShirt.fi https://www.health.InvestmentBrowse.at Creating of balanced meals within the context of hypercholesterolemia   Handouts Provided Include  Meal ideas sheet  Learning Style & Readiness for Change Teaching method utilized: Visual & Auditory  Demonstrated degree of understanding via: Teach Back  Barriers to learning/adherence to lifestyle change: busy work schedule   Goals Established by Pt Increase your fluid to 64 ounces per day Make your dinner meal from home inclusive of complex carbohydrates, plant based proteins or seafood, and non starchy vegetables Create a parfait with chia seeds Cook with olive oil Choose fatty fish such as salmon Have walnuts as a snack   MONITORING & EVALUATION Dietary intake, weekly physical activity  Next Steps  Patient is to call or email with any further questions or concerns.

## 2022-07-05 DIAGNOSIS — Z01419 Encounter for gynecological examination (general) (routine) without abnormal findings: Secondary | ICD-10-CM | POA: Diagnosis not present

## 2022-07-25 DIAGNOSIS — L309 Dermatitis, unspecified: Secondary | ICD-10-CM | POA: Diagnosis not present

## 2022-07-25 DIAGNOSIS — R7989 Other specified abnormal findings of blood chemistry: Secondary | ICD-10-CM | POA: Diagnosis not present

## 2022-07-30 DIAGNOSIS — R7989 Other specified abnormal findings of blood chemistry: Secondary | ICD-10-CM | POA: Diagnosis not present

## 2022-08-19 ENCOUNTER — Encounter: Payer: Self-pay | Admitting: Internal Medicine

## 2022-08-19 ENCOUNTER — Telehealth: Payer: BC Managed Care – PPO | Admitting: Internal Medicine

## 2022-08-19 VITALS — Temp 98.5°F | Wt 218.0 lb

## 2022-08-19 DIAGNOSIS — U071 COVID-19: Secondary | ICD-10-CM | POA: Diagnosis not present

## 2022-08-19 MED ORDER — AZITHROMYCIN 250 MG PO TABS: ORAL_TABLET | ORAL | 0 refills | Status: AC

## 2022-08-19 NOTE — Progress Notes (Signed)
   Subjective:    Patient ID: Brenda Roy, female    DOB: 06/18/1986, 36 y.o.   MRN: [id number]  HPI 36 year old Female seen by interactive audio and video telecommunications.  She is identified using 2 identifiers as Brenda Roy. Brenda Roy, a patient in this practice.  She is at her home and I am at my office.  She is agreeable to visit in this format today.  Patient called to say she had tested positive for COVID-19 yesterday.  Patient has lost her sense of taste and sounds nasally congested.  She has sinus pressure.  She has a mild sore throat.  She has been taking Sudafed for nasal congestion.  She has been taking Tylenol and Advil.  She has no nausea, vomiting, or headache or shaking chills.  She is not short of breath.  Patient has had several COVID vaccines the last 1 on file here is September 2022.  Patient has been active at the [facility] with the [place] holiday.  She has learned that several parishioners have [place].  She has a history of migraine headaches.  She has a history of GE reflux and esophagitis.  History of functional constipation.  History of very mild elevation of LDL.  History of vitamin D deficiency.  She does not smoke or consume alcohol.    Review of Systems see above     Objective:   Physical Exam  She is seen virtually and is in no acute distress but sounds nasally congested as if she has sinusitis.  She is not tachypneic.      Assessment & Plan:  Acute COVID-19 virus infection  Plan: Patient was offered Paxlovid but declined.  We have discussed importance of staying well-hydrated, suggest walking around to prevent atelectasis of the lungs, monitoring for shortness of breath.  May take Tylenol or Aleve if has headache or fever.  It would seem that she has a secondary maxillary sinusitis.  I am sending in Zithromax Z-Pak 2 tabs day 1 followed by 1 tab days 2 through 5.  She does not have significant cough.  She will need to quarantine for 5 days.  Time spent with  this visit is 15 minutes

## 2022-08-19 NOTE — Patient Instructions (Addendum)
Quarantine at home for 5 days.  Paxlovid offered but patient declines.  Appears to have acute sinusitis.  She sounds nasally congested.  I am sending in Zithromax Z-Pak with instructions to take 2 tabs day 1 followed by 1 tab days 2 through 5.  May take Tylenol or Aleve if has headache or fever.  Stay well-hydrated.  Monitor for shortness of breath.  Walk some to prevent atelectasis of the lungs.   Call if symptoms worsen or not improving.  We are sorry to hear you are ill.

## 2022-08-19 NOTE — Telephone Encounter (Signed)
Scheduled video visit 

## 2022-09-18 ENCOUNTER — Other Ambulatory Visit: Payer: Self-pay | Admitting: Internal Medicine

## 2022-09-24 ENCOUNTER — Other Ambulatory Visit: Payer: BC Managed Care – PPO

## 2022-09-24 DIAGNOSIS — E7800 Pure hypercholesterolemia, unspecified: Secondary | ICD-10-CM | POA: Diagnosis not present

## 2022-09-25 LAB — LIPID PANEL
Cholesterol: 224 mg/dL — ABNORMAL HIGH (ref <200)
HDL: 62 mg/dL (ref >=50)
LDL Cholesterol (Calc): 131 mg/dL (calc) — ABNORMAL HIGH
Non-HDL Cholesterol (Calc): 162 mg/dL (calc) — ABNORMAL HIGH (ref <130)
Total CHOL/HDL Ratio: 3.6 (calc) (ref <5.0)
Triglycerides: 171 mg/dL — ABNORMAL HIGH (ref <150)

## 2022-09-26 ENCOUNTER — Encounter: Payer: Self-pay | Admitting: Internal Medicine

## 2022-09-26 ENCOUNTER — Ambulatory Visit: Payer: BC Managed Care – PPO | Admitting: Internal Medicine

## 2022-09-26 VITALS — BP 112/70 | HR 88 | Temp 98.5°F | Ht 65.0 in | Wt 228.4 lb

## 2022-09-26 DIAGNOSIS — E559 Vitamin D deficiency, unspecified: Secondary | ICD-10-CM

## 2022-09-26 DIAGNOSIS — Z7185 Encounter for immunization safety counseling: Secondary | ICD-10-CM | POA: Diagnosis not present

## 2022-09-26 DIAGNOSIS — Z6838 Body mass index (BMI) 38.0-38.9, adult: Secondary | ICD-10-CM | POA: Diagnosis not present

## 2022-09-26 DIAGNOSIS — E782 Mixed hyperlipidemia: Secondary | ICD-10-CM | POA: Diagnosis not present

## 2022-09-26 DIAGNOSIS — Z23 Encounter for immunization: Secondary | ICD-10-CM

## 2022-09-26 MED ORDER — ONDANSETRON HCL 4 MG PO TABS: 4.0000 mg | ORAL_TABLET | Freq: Three times a day (TID) | ORAL | 0 refills | Status: DC | PRN

## 2022-09-26 MED ORDER — LEVOFLOXACIN 500 MG PO TABS: 500.0000 mg | ORAL_TABLET | Freq: Every day | ORAL | 0 refills | Status: DC

## 2022-09-26 NOTE — Progress Notes (Signed)
   Subjective:    Patient ID: Brenda Roy, female    DOB: 11-Nov-1986, 36 y.o.   MRN: [id number]  HPI Here to follow up on hyperlipidemia.  Patient was seen in April and total cholesterol was 210.  Total cholesterol is now 224.  HDL was 62 and was 64 in April.  Triglycerides have increased from 148 to 171.  LDL has increased from 120 to 131.  Patient does not want to be on statin medication.  She is planning a humanitarian trip to [place] in the very near future perhaps later this coming weekend.  She is awaiting for further instructions.  She continues to work at [employer]. We discussed travel medications including Levaquin which would treat pneumonia and urinary tract infection and Zofran if needed for nausea.  She should continue with high-dose vitamin D 50,000 units weekly.  If she has history of vitamin D deficiency and is on high-dose vitamin D weekly.   Review of Systems her spirits are good and she says that her grief is better.     Objective:   Physical Exam Blood pressure 112/70 pulse 88 temperature 98.5 degrees pulse oximetry 99% weight 228 pounds 6.4 ounces BMI 38.01  Chest clear.  Cardiac exam: Regular rate and rhythm.     Assessment & Plan:  Travel advice encounter-Zofran and Levaquin prescribed  Hyperlipidemia-does not want to be on lipid-lowering medication  History of vitamin D deficiency-continue high-dose vitamin D weekly  Plan: Levaquin 500 mg daily for 10 days if needed for urinary infection or pneumonia.  Zofran 4 mg tab to take every 8 hours if needed for nausea.  Tetanus immunization update given today.    She is also receiving a flu vaccine today.

## 2022-09-26 NOTE — Patient Instructions (Addendum)
You have received a flu vaccine today and a tetanus immunization update.  Please take with you to [place] Levaquin 500 mg daily to take if needed for a 10-day course which will treat a urinary tract infection or pneumonia.  Zofran 4 mg tablets have been prescribed to take every 8 hours if needed for nausea.  Please be well and safe.  After you return home, if you would like we can discuss hyperlipidemia treatment.  Your health maintenance exam is due April 2024.  It was a pleasure to see you today.

## 2023-01-04 IMAGING — US US RENAL
1 series · 14 of 25 positions shown · non-contrast
Comparison: None Available.

CLINICAL DATA: Elevated creatinine

EXAM:
RENAL / URINARY TRACT ULTRASOUND COMPLETE

[Series 1: us renal · 0.26mm/px · 14 of 40 slices shown]
[im 1/40]
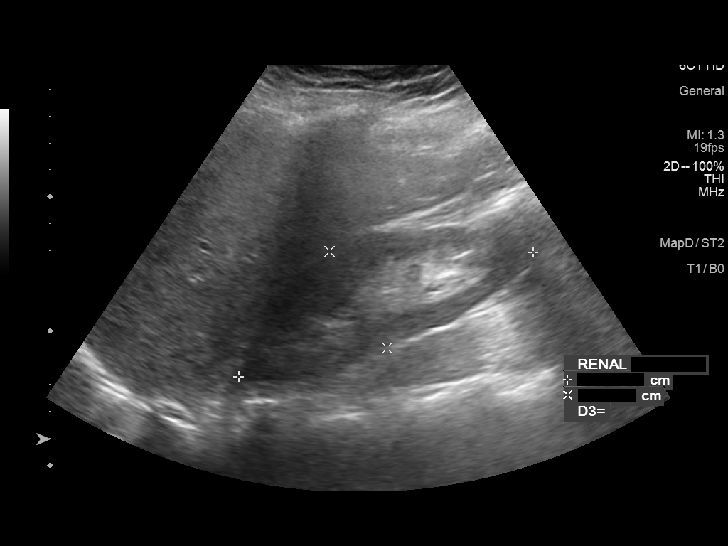
[im 4/40]
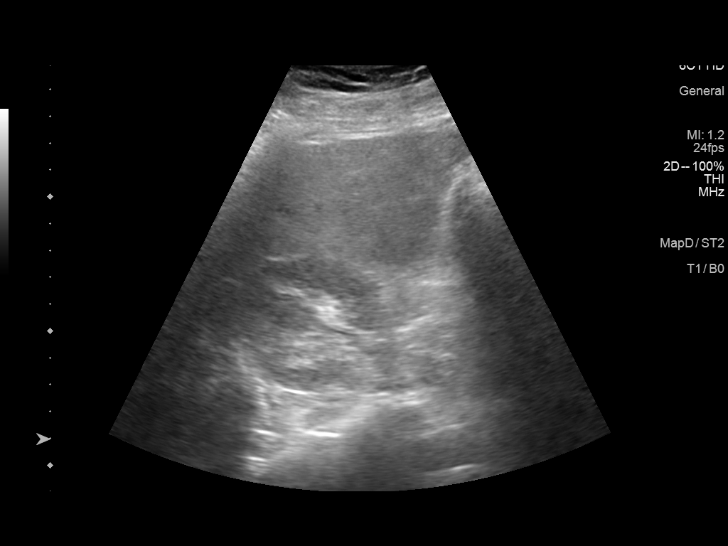
[im 7/40]
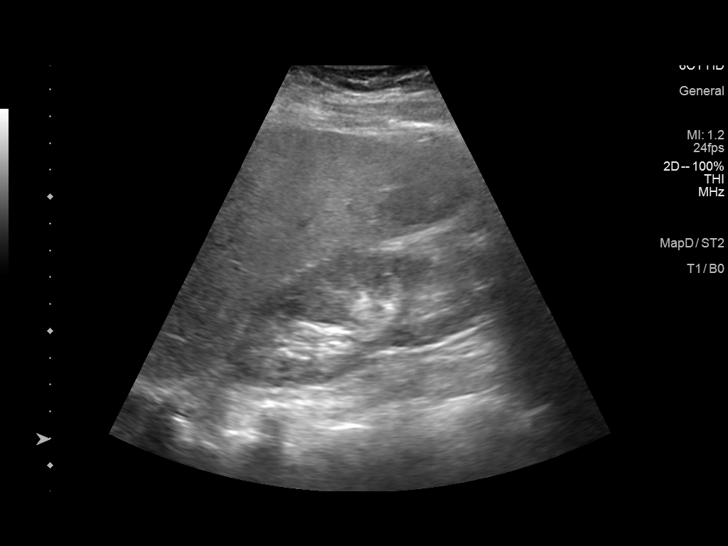
[im 10/40]
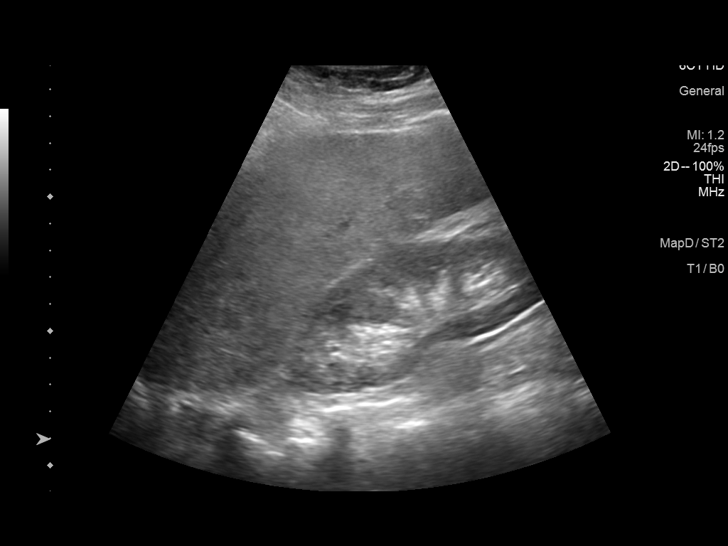
[im 14/40]
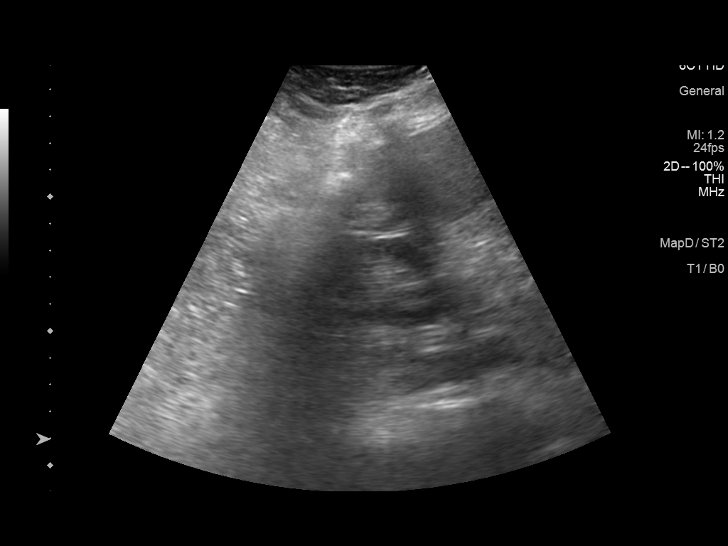
[im 15/40]
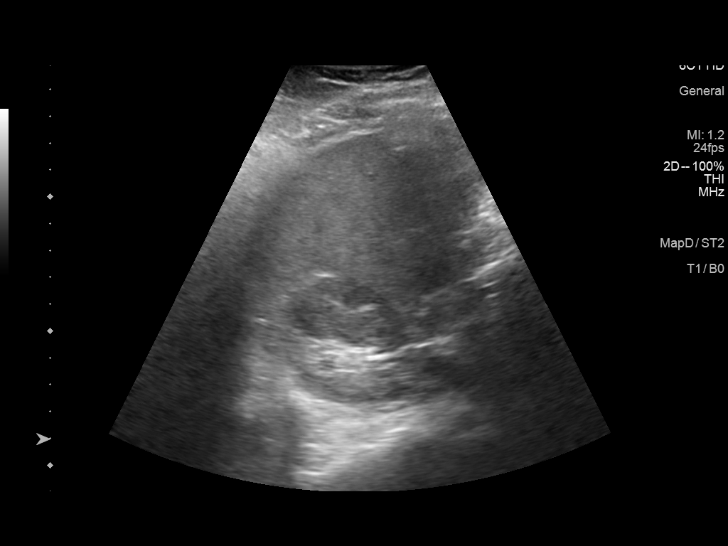
[im 18/40]
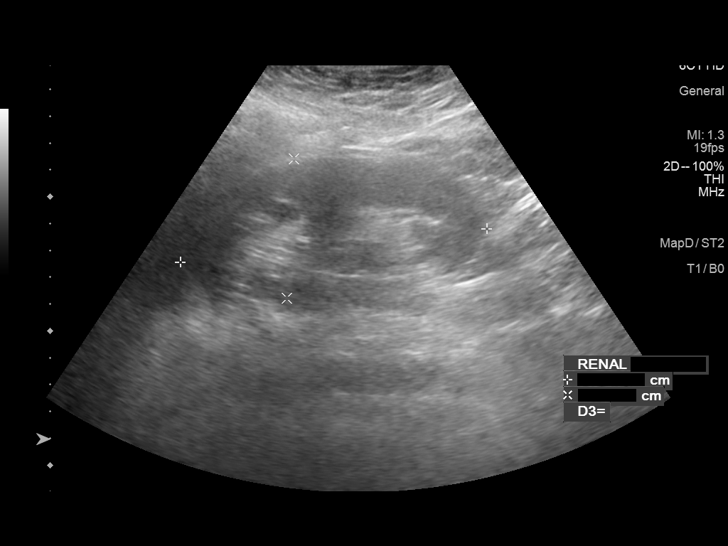
[im 22/40]
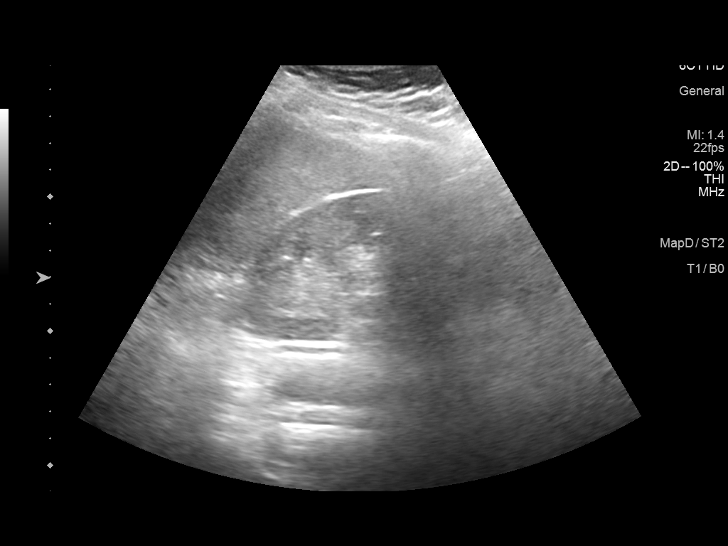
[im 25/40]
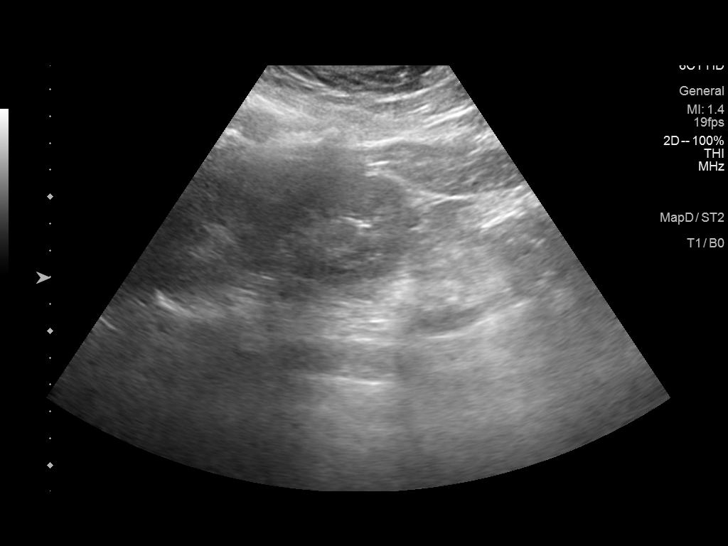
[im 27/40]
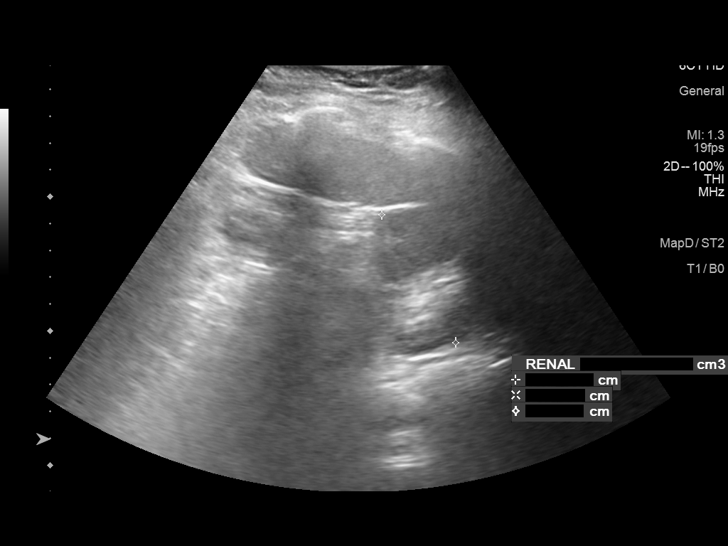
[im 30/40]
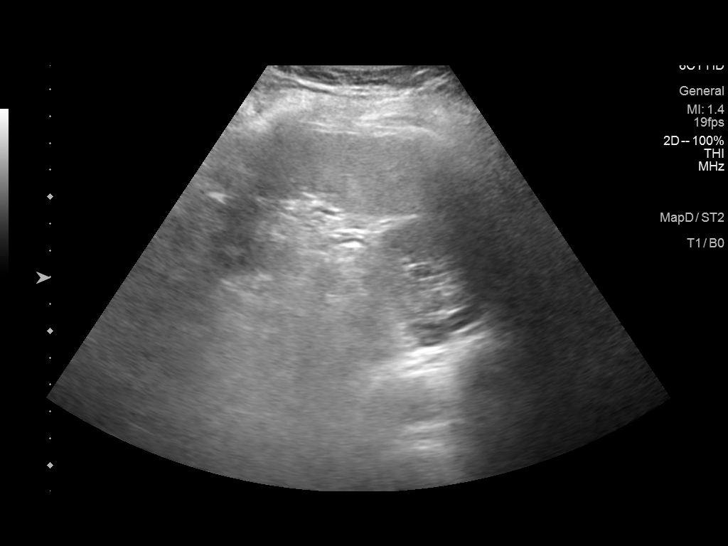
[im 33/40]
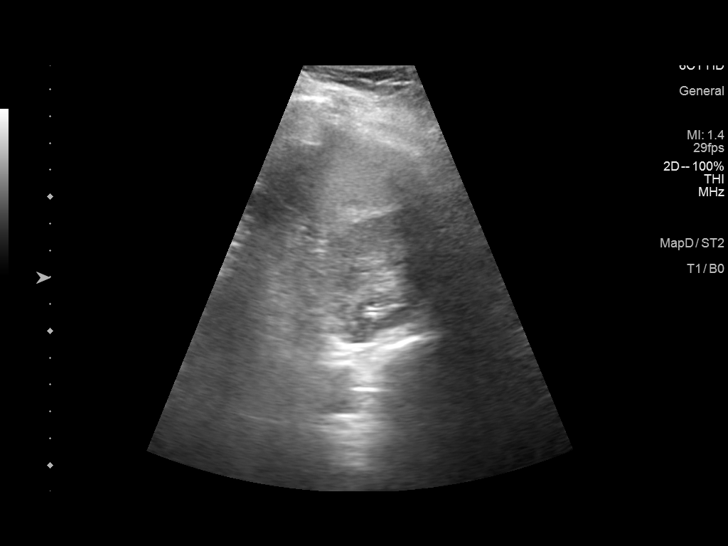
[im 36/40]
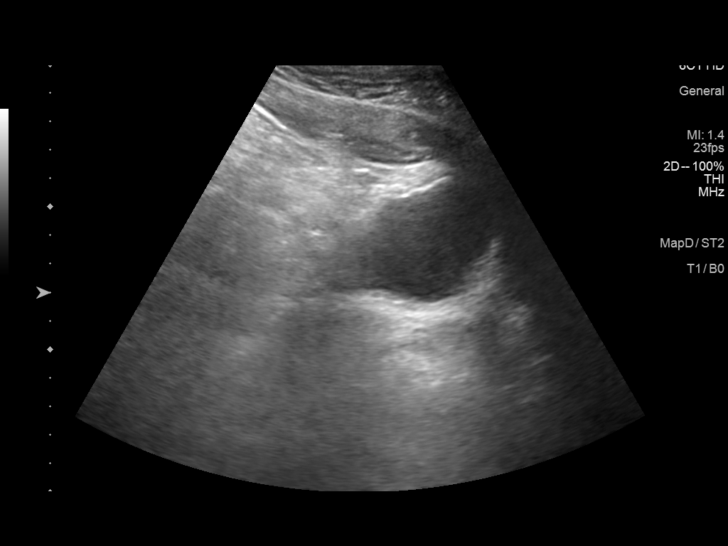
[im 40/40]
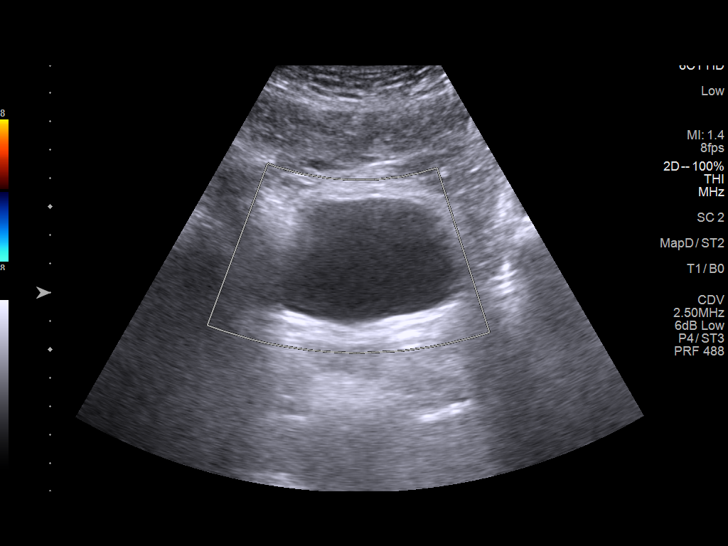

[14 of 25 positions shown; findings below may reference images not displayed]

FINDINGS: Right Kidney:

Renal measurements: 11.9 x 4.2 x 5.5 cm = volume: 144 mL.
Echogenicity within normal limits. No mass or hydronephrosis
visualized.

Left Kidney:

Renal measurements: 11.5 x 5.2 x 5.5 cm = volume: 172.4 mL.
Echogenicity within normal limits. No mass or hydronephrosis
visualized.

Bladder:

Appears normal for degree of bladder distention.

Other:

None.
IMPRESSION: Normal study.  No cause for elevated creatinine identified.

## 2023-05-08 DIAGNOSIS — H0288B Meibomian gland dysfunction left eye, upper and lower eyelids: Secondary | ICD-10-CM | POA: Diagnosis not present

## 2023-05-08 DIAGNOSIS — H0288A Meibomian gland dysfunction right eye, upper and lower eyelids: Secondary | ICD-10-CM | POA: Diagnosis not present

## 2023-05-08 DIAGNOSIS — H16223 Keratoconjunctivitis sicca, not specified as Sjogren's, bilateral: Secondary | ICD-10-CM | POA: Diagnosis not present

## 2023-05-29 ENCOUNTER — Telehealth: Payer: Self-pay | Admitting: Internal Medicine

## 2023-05-29 ENCOUNTER — Ambulatory Visit: Payer: BC Managed Care – PPO | Admitting: Internal Medicine

## 2023-05-29 VITALS — BP 126/68 | HR 72 | Temp 99.5°F | Resp 16 | Ht 65.0 in | Wt 217.8 lb

## 2023-05-29 DIAGNOSIS — R2232 Localized swelling, mass and lump, left upper limb: Secondary | ICD-10-CM | POA: Diagnosis not present

## 2023-05-29 DIAGNOSIS — M79622 Pain in left upper arm: Secondary | ICD-10-CM | POA: Diagnosis not present

## 2023-05-29 NOTE — Progress Notes (Signed)
Patient Care Team: Margaree Mackintosh, MD as PCP - General (Internal Medicine)  Visit Date: 05/29/23  Subjective:    Patient ID: Brenda Roy , Female   DOB: 1986-04-02, 37 y.o.    MRN: [id number]   37 y.o. Female presents today for swelling, pain in left axilla since morning of 05/28/23. Has not traveled anywhere. No recent sickness. Denies sore throat, headaches, fever, chills. No skin cuts.  Past Medical History:  Diagnosis Date   Fibroadenoma of breast    IBS (irritable bowel syndrome)    UTI (urinary tract infection)      Family History  Problem Relation Age of Onset   Other Mother        GERD   Hypertension Father    Gastric cancer Maternal Grandmother    Other Paternal Grandmother        Not clear but probably stomach problems    Esophageal cancer Maternal Uncle    Breast cancer Paternal Aunt    Esophageal cancer Cousin        dad's niece    Colon cancer Neg Hx     Social History   Social History Narrative   Not on file      Review of Systems  Constitutional:  Negative for chills, fever and malaise/fatigue.  HENT:  Negative for congestion and sore throat.   Eyes:  Negative for blurred vision.  Respiratory:  Negative for cough and shortness of breath.   Cardiovascular:  Negative for chest pain, palpitations and leg swelling.  Gastrointestinal:  Negative for vomiting.  Musculoskeletal:  Negative for back pain.       (+) Pain left axilla  Skin:  Negative for rash.  Neurological:  Negative for loss of consciousness and headaches.        Objective:   Vitals: BP 126/68   Pulse 72   Temp 99.5 F (37.5 C) (Tympanic)   Resp 16   Ht 5\' 5"  (1.651 m)   Wt 217 lb 12 oz (98.8 kg)   LMP 05/22/2023 (Exact Date)   SpO2 99%   BMI 36.24 kg/m    Physical Exam Vitals and nursing note reviewed.  Constitutional:      General: She is not in acute distress.    Appearance: Normal appearance. She is not toxic-appearing.  HENT:     Head: Normocephalic and  atraumatic.  Pulmonary:     Effort: Pulmonary effort is normal.  Chest:     Comments: No swelling, masses in left axilla. Abdominal:     Comments: No masses left and right inguinal area.  Lymphadenopathy:     Cervical: No cervical adenopathy.     Right cervical: No superficial cervical adenopathy. Skin:    General: Skin is warm and dry.  Neurological:     Mental Status: She is alert and oriented to person, place, and time. Mental status is at baseline.  Psychiatric:        Mood and Affect: Mood normal.        Behavior: Behavior normal.        Thought Content: Thought content normal.        Judgment: Judgment normal.       Results:   Studies obtained and personally reviewed by me:   Labs:       Component Value Date/Time   NA 139 04/12/2022 0944   K 4.4 04/12/2022 0944   CL 107 04/12/2022 0944   CO2 23 04/12/2022 0944   GLUCOSE 87  04/12/2022 0944   BUN 15 04/12/2022 0944   CREATININE 0.99 (H) 04/12/2022 0944   CALCIUM 9.4 04/12/2022 0944   PROT 7.1 03/21/2022 1104   ALBUMIN 4.0 05/23/2017 0931   AST 15 03/21/2022 1104   ALT 16 03/21/2022 1104   ALKPHOS 56 05/23/2017 0931   BILITOT 0.4 03/21/2022 1104   GFRNONAA 84 09/30/2019 1008   GFRAA 97 09/30/2019 1008     Lab Results  Component Value Date   WBC 8.1 03/21/2022   HGB 13.2 03/21/2022   HCT 38.3 03/21/2022   MCV 87.4 03/21/2022   PLT 259 03/21/2022    Lab Results  Component Value Date   CHOL 224 (H) 09/24/2022   HDL 62 09/24/2022   LDLCALC 131 (H) 09/24/2022   TRIG 171 (H) 09/24/2022   CHOLHDL 3.6 09/24/2022    Lab Results  Component Value Date   HGBA1C 5.4 06/22/2012     Lab Results  Component Value Date   TSH 2.23 03/21/2022      Assessment & Plan:   Pain left axilla: can take Advil, Alieve. Contact us if symptoms do not improve.  I cannot appreciate any adenopathy in her axillary area whatsoever.  She does not have any cervical adenopathy, supraclavicular adenopathy,  hepatosplenomegaly, popliteal adenopathy, inguinal adenopathy.  Continue to monitor symptoms and call if symptoms worsen.  She is comfortable waiting.   I,Alexander Ruley,acting as a Neurosurgeon for Margaree Mackintosh, MD.,have documented all relevant documentation on the behalf of Margaree Mackintosh, MD,as directed by  Margaree Mackintosh, MD while in the presence of Margaree Mackintosh, MD. I, Margaree Mackintosh, MD, have reviewed all documentation for this visit. The documentation on 05/30/23 for the exam, diagnosis, procedures, and orders are all accurate and complete.  IMargaree Mackintosh, MD, have reviewed all documentation for this visit. The documentation on 05/30/23 for the exam, diagnosis, procedures, and orders are all accurate and complete.

## 2023-05-29 NOTE — Telephone Encounter (Signed)
Patient scheduled.

## 2023-05-29 NOTE — Telephone Encounter (Signed)
Patient called and said that she has a lymph node under her left armpit is swollen and hurts and she would like to get checked out. She said it started yesterday morning. When would you like to see her?

## 2023-05-30 NOTE — Patient Instructions (Signed)
Suspect discomfort and fullness that she felt could be axillary adenitis or musculoskeletal pain.  She will continue to monitor symptoms and let me know if symptoms do not resolve or seem to worsen.  May take over-the-counter Tylenol or ibuprofen if needed.

## 2023-07-07 DIAGNOSIS — Z01419 Encounter for gynecological examination (general) (routine) without abnormal findings: Secondary | ICD-10-CM | POA: Diagnosis not present

## 2023-07-07 DIAGNOSIS — Z6836 Body mass index (BMI) 36.0-36.9, adult: Secondary | ICD-10-CM | POA: Diagnosis not present

## 2023-07-24 ENCOUNTER — Other Ambulatory Visit: Payer: Self-pay | Admitting: Medical Genetics

## 2023-07-24 DIAGNOSIS — Z006 Encounter for examination for normal comparison and control in clinical research program: Secondary | ICD-10-CM

## 2023-07-25 DIAGNOSIS — Z111 Encounter for screening for respiratory tuberculosis: Secondary | ICD-10-CM | POA: Diagnosis not present

## 2023-07-28 ENCOUNTER — Other Ambulatory Visit (HOSPITAL_COMMUNITY)
Admission: RE | Admit: 2023-07-28 | Discharge: 2023-07-28 | Disposition: A | Discharge status: Home / Self Care | Payer: BC Managed Care – PPO | Attending: Oncology | Admitting: Oncology

## 2023-07-28 DIAGNOSIS — Z006 Encounter for examination for normal comparison and control in clinical research program: Secondary | ICD-10-CM | POA: Insufficient documentation

## 2023-07-28 DIAGNOSIS — Z111 Encounter for screening for respiratory tuberculosis: Secondary | ICD-10-CM | POA: Diagnosis not present

## 2023-08-05 LAB — GENECONNECT MOLECULAR SCREEN: Genetic Analysis Overall Interpretation: NEGATIVE

## 2023-08-07 DIAGNOSIS — H5712 Ocular pain, left eye: Secondary | ICD-10-CM | POA: Diagnosis not present

## 2023-08-07 DIAGNOSIS — H11432 Conjunctival hyperemia, left eye: Secondary | ICD-10-CM | POA: Diagnosis not present

## 2023-10-27 DIAGNOSIS — H11433 Conjunctival hyperemia, bilateral: Secondary | ICD-10-CM | POA: Diagnosis not present

## 2023-10-27 DIAGNOSIS — H16223 Keratoconjunctivitis sicca, not specified as Sjogren's, bilateral: Secondary | ICD-10-CM | POA: Diagnosis not present

## 2024-02-02 ENCOUNTER — Ambulatory Visit: Admitting: Internal Medicine

## 2024-02-02 VITALS — BP 102/70 | HR 103 | Temp 98.0°F | Ht 65.0 in | Wt 217.0 lb

## 2024-02-02 DIAGNOSIS — R509 Fever, unspecified: Secondary | ICD-10-CM | POA: Diagnosis not present

## 2024-02-02 DIAGNOSIS — J22 Unspecified acute lower respiratory infection: Secondary | ICD-10-CM | POA: Diagnosis not present

## 2024-02-02 DIAGNOSIS — R059 Cough, unspecified: Secondary | ICD-10-CM | POA: Diagnosis not present

## 2024-02-02 LAB — POC COVID19/FLU A&B COMBO
Covid Antigen, POC: NEGATIVE
Influenza A Antigen, POC: NEGATIVE
Influenza B Antigen, POC: NEGATIVE

## 2024-02-02 MED ORDER — AZITHROMYCIN 250 MG PO TABS: ORAL_TABLET | ORAL | 0 refills | Status: AC

## 2024-02-02 NOTE — Progress Notes (Addendum)
 Patient Care Team: Margaree Mackintosh, MD as PCP - General (Internal Medicine)  Visit Date: 02/02/24  Subjective:   Chief Complaint  Patient presents with   Cough   Fever  Patient Brenda Roy,Female DOB:17-Jun-1986,38 y.o. AVW:[id number]   38 y.o. Female presents today for acute sick visit with Fever & Cough. Says that she recently returned from a conference in [place], [place], but didn't have any symptoms until she started to develop a fever, which she took a Tylenol to alleviate, woke up the next day with extreme fatigue; has started to cough with expectoration of yellow/green mucus, and does note morning headaches that are relieved by Sudafed.  Past Medical History:  Diagnosis Date   Fibroadenoma of breast    IBS (irritable bowel syndrome)    UTI (urinary tract infection)     Allergies  Allergen Reactions   Minocycline Hives and Rash   Sulfa Antibiotics Rash    Family History  Problem Relation Age of Onset   Other Mother        GERD   Hypertension Father    Gastric cancer Maternal Grandmother    Other Paternal Grandmother        Not clear but probably stomach problems    Esophageal cancer Maternal Uncle    Breast cancer Paternal Aunt    Esophageal cancer Cousin        dad's niece    Colon cancer Neg Hx    Social Hx: Masters degree. Lives in [place]. Previously worked as Librarian, academic for [employer]. Single. Does not smoke. Does not consume alcohol.  Review of Systems  Constitutional:  Positive for fever and malaise/fatigue.  Respiratory:  Positive for cough and sputum production (yellow/green).   Neurological:  Positive for headaches (mornings - relieved by Sudafed).     Objective:  Vitals: BP 102/70   Pulse (!) 103   Temp 98 F (36.7 C) (Tympanic)   Ht 5\' 5"  (1.651 m)   Wt 217 lb (98.4 kg)   LMP 12/20/2023   SpO2 98%   BMI 36.11 kg/m   Physical Exam Vitals and nursing note reviewed.  Constitutional:      General: She is not in acute  distress.    Appearance: Normal appearance. She is not toxic-appearing.  HENT:     Head: Normocephalic and atraumatic.     Left Ear: Tympanic membrane is not erythematous.     Ears:     Comments: Left TM full, not red.  Pulmonary:     Effort: Pulmonary effort is normal.  Skin:    General: Skin is warm and dry.  Neurological:     Mental Status: She is alert and oriented to person, place, and time. Mental status is at baseline.  Psychiatric:        Mood and Affect: Mood normal.        Behavior: Behavior normal.        Thought Content: Thought content normal.        Judgment: Judgment normal.     Results:  Studies Obtained And Personally Reviewed By Me: Labs:     Component Value Date/Time   NA 139 04/12/2022 0944   K 4.4 04/12/2022 0944   CL 107 04/12/2022 0944   CO2 23 04/12/2022 0944   GLUCOSE 87 04/12/2022 0944   BUN 15 04/12/2022 0944   CREATININE 0.99 (H) 04/12/2022 0944   CALCIUM 9.4 04/12/2022 0944   PROT 7.1 03/21/2022 1104   ALBUMIN 4.0 05/23/2017  0931   AST 15 03/21/2022 1104   ALT 16 03/21/2022 1104   ALKPHOS 56 05/23/2017 0931   BILITOT 0.4 03/21/2022 1104   GFRNONAA 84 09/30/2019 1008   GFRAA 97 09/30/2019 1008    Lab Results  Component Value Date   WBC 8.1 03/21/2022   HGB 13.2 03/21/2022   HCT 38.3 03/21/2022   MCV 87.4 03/21/2022   PLT 259 03/21/2022   Lab Results  Component Value Date   CHOL 224 (H) 09/24/2022   HDL 62 09/24/2022   LDLCALC 131 (H) 09/24/2022   TRIG 171 (H) 09/24/2022   CHOLHDL 3.6 09/24/2022   Lab Results  Component Value Date   HGBA1C 5.4 06/22/2012    Lab Results  Component Value Date   TSH 2.23 03/21/2022    Results for orders placed or performed in visit on 02/02/24  POC Covid19/Flu A&B Antigen  Result Value Ref Range   Influenza A Antigen, POC Negative Negative   Influenza B Antigen, POC Negative Negative   Covid Antigen, POC Negative Negative   Assessment & Plan:   IMargaree Mackintosh, MD, have reviewed all  documentation for this visit. The documentation on 02/02/24 for the exam, diagnosis, procedures, and orders are all accurate and complete.   Symptoms seem consistent with acute lower respiratory infection likely bronchitis.    NEG -Flu A/B & Covid-19 tests today. Sending in 250 mg Azithromycin - take 2 tablets on Day 1 and 1 tablet on Days 2-5. Contact us if symptoms worsen/persist despite treatment. Rest and stay well hydrated.     I,Emily Lagle,acting as a Neurosurgeon for Margaree Mackintosh, MD.,have documented all relevant documentation on the behalf of Margaree Mackintosh, MD,as directed by  Margaree Mackintosh, MD while in the presence of Margaree Mackintosh, MD.   I, Margaree Mackintosh, MD, have reviewed all documentation for this visit. The documentation on 02/02/24 for the exam, diagnosis, procedures, and orders are all accurate and complete.

## 2024-02-02 NOTE — Patient Instructions (Addendum)
 We are sorry you are not feeling well today. Please rest and stay well hydrated. Take Zithromax Z pak for acute lower respiratory infection as follows: 2 tabs day 1 followed by one tab days 2-5. Call if not better in 5-7 days or sooner if worse.

## 2024-09-13 ENCOUNTER — Other Ambulatory Visit: Payer: Self-pay | Admitting: Medical Genetics

## 2024-09-13 DIAGNOSIS — Z006 Encounter for examination for normal comparison and control in clinical research program: Secondary | ICD-10-CM

## 2024-09-13 NOTE — Addendum Note (Signed)
 Addended by: REMUS NOEMI PARAS on: 09/13/2024 04:36 PM   Modules accepted: Orders
